# Patient Record
Sex: Male | Born: 1935 | Race: White | Hispanic: No | Marital: Married | Smoking: Former smoker
Health system: Southern US, Community
[De-identification: ages and names within clinical notes are randomized; demographics above are authoritative.]

## PROBLEM LIST (undated history)

## (undated) DIAGNOSIS — IMO0001 Reserved for inherently not codable concepts without codable children: Secondary | ICD-10-CM

## (undated) DIAGNOSIS — M1990 Unspecified osteoarthritis, unspecified site: Secondary | ICD-10-CM

## (undated) DIAGNOSIS — I2510 Atherosclerotic heart disease of native coronary artery without angina pectoris: Secondary | ICD-10-CM

## (undated) DIAGNOSIS — N400 Enlarged prostate without lower urinary tract symptoms: Secondary | ICD-10-CM

## (undated) DIAGNOSIS — I10 Essential (primary) hypertension: Secondary | ICD-10-CM

## (undated) DIAGNOSIS — K219 Gastro-esophageal reflux disease without esophagitis: Secondary | ICD-10-CM

## (undated) DIAGNOSIS — E039 Hypothyroidism, unspecified: Secondary | ICD-10-CM

## (undated) DIAGNOSIS — G629 Polyneuropathy, unspecified: Secondary | ICD-10-CM

## (undated) HISTORY — PX: OTHER SURGICAL HISTORY: SHX169

## (undated) HISTORY — PX: JOINT REPLACEMENT: SHX530

## (undated) HISTORY — PX: CORONARY ARTERY BYPASS GRAFT: SHX141

---

## 1999-04-12 ENCOUNTER — Ambulatory Visit (HOSPITAL_BASED_OUTPATIENT_CLINIC_OR_DEPARTMENT_OTHER): Admission: RE | Admit: 1999-04-12 | Discharge: 1999-04-12 | Payer: Self-pay | Admitting: Orthopedic Surgery

## 2005-11-12 ENCOUNTER — Inpatient Hospital Stay: Payer: Self-pay | Admitting: General Practice

## 2006-04-11 ENCOUNTER — Ambulatory Visit: Payer: Self-pay | Admitting: Unknown Physician Specialty

## 2006-11-27 ENCOUNTER — Ambulatory Visit: Payer: Self-pay | Admitting: Internal Medicine

## 2009-08-30 ENCOUNTER — Ambulatory Visit: Payer: Self-pay | Admitting: Cardiology

## 2009-11-02 ENCOUNTER — Encounter: Payer: Self-pay | Admitting: Cardiology

## 2009-11-03 ENCOUNTER — Encounter: Payer: Self-pay | Admitting: Cardiology

## 2009-12-04 ENCOUNTER — Encounter: Payer: Self-pay | Admitting: Cardiology

## 2011-06-10 ENCOUNTER — Ambulatory Visit: Payer: Self-pay | Admitting: Unknown Physician Specialty

## 2011-06-18 ENCOUNTER — Ambulatory Visit: Payer: Self-pay | Admitting: Internal Medicine

## 2011-07-23 ENCOUNTER — Ambulatory Visit: Payer: Self-pay | Admitting: Cardiology

## 2014-06-10 DIAGNOSIS — I739 Peripheral vascular disease, unspecified: Secondary | ICD-10-CM | POA: Diagnosis not present

## 2014-06-10 DIAGNOSIS — I25810 Atherosclerosis of coronary artery bypass graft(s) without angina pectoris: Secondary | ICD-10-CM | POA: Diagnosis not present

## 2014-06-10 DIAGNOSIS — E782 Mixed hyperlipidemia: Secondary | ICD-10-CM | POA: Diagnosis not present

## 2014-06-10 DIAGNOSIS — I10 Essential (primary) hypertension: Secondary | ICD-10-CM | POA: Diagnosis not present

## 2014-06-23 DIAGNOSIS — L821 Other seborrheic keratosis: Secondary | ICD-10-CM | POA: Diagnosis not present

## 2014-06-23 DIAGNOSIS — L570 Actinic keratosis: Secondary | ICD-10-CM | POA: Diagnosis not present

## 2014-06-23 DIAGNOSIS — L820 Inflamed seborrheic keratosis: Secondary | ICD-10-CM | POA: Diagnosis not present

## 2014-06-23 DIAGNOSIS — L578 Other skin changes due to chronic exposure to nonionizing radiation: Secondary | ICD-10-CM | POA: Diagnosis not present

## 2014-08-23 DIAGNOSIS — R12 Heartburn: Secondary | ICD-10-CM | POA: Diagnosis not present

## 2014-09-27 DIAGNOSIS — I739 Peripheral vascular disease, unspecified: Secondary | ICD-10-CM | POA: Diagnosis not present

## 2014-09-27 DIAGNOSIS — R5383 Other fatigue: Secondary | ICD-10-CM | POA: Diagnosis not present

## 2014-09-27 DIAGNOSIS — I10 Essential (primary) hypertension: Secondary | ICD-10-CM | POA: Diagnosis not present

## 2014-09-27 DIAGNOSIS — I25810 Atherosclerosis of coronary artery bypass graft(s) without angina pectoris: Secondary | ICD-10-CM | POA: Diagnosis not present

## 2014-09-27 DIAGNOSIS — R972 Elevated prostate specific antigen [PSA]: Secondary | ICD-10-CM | POA: Diagnosis not present

## 2014-09-27 DIAGNOSIS — Z79899 Other long term (current) drug therapy: Secondary | ICD-10-CM | POA: Diagnosis not present

## 2014-09-27 DIAGNOSIS — E782 Mixed hyperlipidemia: Secondary | ICD-10-CM | POA: Diagnosis not present

## 2014-09-30 DIAGNOSIS — R55 Syncope and collapse: Secondary | ICD-10-CM | POA: Diagnosis not present

## 2014-09-30 DIAGNOSIS — I6523 Occlusion and stenosis of bilateral carotid arteries: Secondary | ICD-10-CM | POA: Diagnosis not present

## 2014-10-05 DIAGNOSIS — R55 Syncope and collapse: Secondary | ICD-10-CM | POA: Diagnosis not present

## 2014-11-29 DIAGNOSIS — Z96652 Presence of left artificial knee joint: Secondary | ICD-10-CM | POA: Diagnosis not present

## 2014-11-29 DIAGNOSIS — Z96651 Presence of right artificial knee joint: Secondary | ICD-10-CM | POA: Diagnosis not present

## 2014-12-22 DIAGNOSIS — L820 Inflamed seborrheic keratosis: Secondary | ICD-10-CM | POA: Diagnosis not present

## 2014-12-22 DIAGNOSIS — D692 Other nonthrombocytopenic purpura: Secondary | ICD-10-CM | POA: Diagnosis not present

## 2014-12-22 DIAGNOSIS — L570 Actinic keratosis: Secondary | ICD-10-CM | POA: Diagnosis not present

## 2014-12-22 DIAGNOSIS — L578 Other skin changes due to chronic exposure to nonionizing radiation: Secondary | ICD-10-CM | POA: Diagnosis not present

## 2014-12-22 DIAGNOSIS — L814 Other melanin hyperpigmentation: Secondary | ICD-10-CM | POA: Diagnosis not present

## 2015-01-10 DIAGNOSIS — I10 Essential (primary) hypertension: Secondary | ICD-10-CM | POA: Diagnosis not present

## 2015-01-10 DIAGNOSIS — I25709 Atherosclerosis of coronary artery bypass graft(s), unspecified, with unspecified angina pectoris: Secondary | ICD-10-CM | POA: Diagnosis not present

## 2015-01-10 DIAGNOSIS — I739 Peripheral vascular disease, unspecified: Secondary | ICD-10-CM | POA: Diagnosis not present

## 2015-01-10 DIAGNOSIS — E782 Mixed hyperlipidemia: Secondary | ICD-10-CM | POA: Diagnosis not present

## 2015-04-04 DIAGNOSIS — M545 Low back pain: Secondary | ICD-10-CM | POA: Diagnosis not present

## 2015-04-04 DIAGNOSIS — R0609 Other forms of dyspnea: Secondary | ICD-10-CM | POA: Diagnosis not present

## 2015-04-04 DIAGNOSIS — I25810 Atherosclerosis of coronary artery bypass graft(s) without angina pectoris: Secondary | ICD-10-CM | POA: Diagnosis not present

## 2015-04-04 DIAGNOSIS — Z79899 Other long term (current) drug therapy: Secondary | ICD-10-CM | POA: Diagnosis not present

## 2015-04-04 DIAGNOSIS — G8929 Other chronic pain: Secondary | ICD-10-CM | POA: Diagnosis not present

## 2015-04-04 DIAGNOSIS — Z23 Encounter for immunization: Secondary | ICD-10-CM | POA: Diagnosis not present

## 2015-04-04 DIAGNOSIS — Z0000 Encounter for general adult medical examination without abnormal findings: Secondary | ICD-10-CM | POA: Diagnosis not present

## 2015-04-04 DIAGNOSIS — I10 Essential (primary) hypertension: Secondary | ICD-10-CM | POA: Diagnosis not present

## 2015-04-04 DIAGNOSIS — E782 Mixed hyperlipidemia: Secondary | ICD-10-CM | POA: Diagnosis not present

## 2015-04-28 DIAGNOSIS — Z79899 Other long term (current) drug therapy: Secondary | ICD-10-CM | POA: Diagnosis not present

## 2015-04-28 DIAGNOSIS — E782 Mixed hyperlipidemia: Secondary | ICD-10-CM | POA: Diagnosis not present

## 2015-04-28 DIAGNOSIS — I10 Essential (primary) hypertension: Secondary | ICD-10-CM | POA: Diagnosis not present

## 2015-06-30 DIAGNOSIS — I739 Peripheral vascular disease, unspecified: Secondary | ICD-10-CM | POA: Diagnosis not present

## 2015-06-30 DIAGNOSIS — I10 Essential (primary) hypertension: Secondary | ICD-10-CM | POA: Diagnosis not present

## 2015-06-30 DIAGNOSIS — G629 Polyneuropathy, unspecified: Secondary | ICD-10-CM | POA: Diagnosis not present

## 2015-06-30 DIAGNOSIS — E782 Mixed hyperlipidemia: Secondary | ICD-10-CM | POA: Diagnosis not present

## 2015-07-18 DIAGNOSIS — E785 Hyperlipidemia, unspecified: Secondary | ICD-10-CM | POA: Diagnosis not present

## 2015-07-18 DIAGNOSIS — M79609 Pain in unspecified limb: Secondary | ICD-10-CM | POA: Diagnosis not present

## 2015-07-18 DIAGNOSIS — I10 Essential (primary) hypertension: Secondary | ICD-10-CM | POA: Diagnosis not present

## 2015-07-18 DIAGNOSIS — I2510 Atherosclerotic heart disease of native coronary artery without angina pectoris: Secondary | ICD-10-CM | POA: Diagnosis not present

## 2015-07-31 DIAGNOSIS — E782 Mixed hyperlipidemia: Secondary | ICD-10-CM | POA: Diagnosis not present

## 2015-07-31 DIAGNOSIS — I25810 Atherosclerosis of coronary artery bypass graft(s) without angina pectoris: Secondary | ICD-10-CM | POA: Diagnosis not present

## 2015-07-31 DIAGNOSIS — I739 Peripheral vascular disease, unspecified: Secondary | ICD-10-CM | POA: Diagnosis not present

## 2015-07-31 DIAGNOSIS — I10 Essential (primary) hypertension: Secondary | ICD-10-CM | POA: Diagnosis not present

## 2015-08-22 DIAGNOSIS — I2510 Atherosclerotic heart disease of native coronary artery without angina pectoris: Secondary | ICD-10-CM | POA: Diagnosis not present

## 2015-08-22 DIAGNOSIS — I10 Essential (primary) hypertension: Secondary | ICD-10-CM | POA: Diagnosis not present

## 2015-08-22 DIAGNOSIS — E785 Hyperlipidemia, unspecified: Secondary | ICD-10-CM | POA: Diagnosis not present

## 2015-08-22 DIAGNOSIS — M79609 Pain in unspecified limb: Secondary | ICD-10-CM | POA: Diagnosis not present

## 2015-10-03 DIAGNOSIS — G8929 Other chronic pain: Secondary | ICD-10-CM | POA: Diagnosis not present

## 2015-10-03 DIAGNOSIS — Z79899 Other long term (current) drug therapy: Secondary | ICD-10-CM | POA: Diagnosis not present

## 2015-10-03 DIAGNOSIS — Z125 Encounter for screening for malignant neoplasm of prostate: Secondary | ICD-10-CM | POA: Diagnosis not present

## 2015-10-03 DIAGNOSIS — E782 Mixed hyperlipidemia: Secondary | ICD-10-CM | POA: Diagnosis not present

## 2015-10-03 DIAGNOSIS — M545 Low back pain: Secondary | ICD-10-CM | POA: Diagnosis not present

## 2015-10-03 DIAGNOSIS — I10 Essential (primary) hypertension: Secondary | ICD-10-CM | POA: Diagnosis not present

## 2015-11-14 DIAGNOSIS — H2513 Age-related nuclear cataract, bilateral: Secondary | ICD-10-CM | POA: Diagnosis not present

## 2015-12-08 DIAGNOSIS — H2513 Age-related nuclear cataract, bilateral: Secondary | ICD-10-CM | POA: Diagnosis not present

## 2015-12-18 ENCOUNTER — Encounter: Payer: Self-pay | Admitting: *Deleted

## 2015-12-21 ENCOUNTER — Ambulatory Visit: Payer: Commercial Managed Care - HMO | Admitting: Certified Registered Nurse Anesthetist

## 2015-12-21 ENCOUNTER — Encounter
Admission: RE | Disposition: A | Discharge status: Home / Self Care | Payer: Self-pay | Source: Ambulatory Visit | Attending: Ophthalmology

## 2015-12-21 ENCOUNTER — Ambulatory Visit
Admission: RE | Admit: 2015-12-21 | Discharge: 2015-12-21 | Disposition: A | Discharge status: Home / Self Care | Payer: Commercial Managed Care - HMO | Source: Ambulatory Visit | Attending: Ophthalmology | Admitting: Ophthalmology

## 2015-12-21 DIAGNOSIS — H2181 Floppy iris syndrome: Secondary | ICD-10-CM | POA: Insufficient documentation

## 2015-12-21 DIAGNOSIS — N400 Enlarged prostate without lower urinary tract symptoms: Secondary | ICD-10-CM | POA: Diagnosis not present

## 2015-12-21 DIAGNOSIS — E7800 Pure hypercholesterolemia, unspecified: Secondary | ICD-10-CM | POA: Diagnosis not present

## 2015-12-21 DIAGNOSIS — H2513 Age-related nuclear cataract, bilateral: Secondary | ICD-10-CM | POA: Diagnosis not present

## 2015-12-21 DIAGNOSIS — Z79899 Other long term (current) drug therapy: Secondary | ICD-10-CM | POA: Insufficient documentation

## 2015-12-21 DIAGNOSIS — Z87891 Personal history of nicotine dependence: Secondary | ICD-10-CM | POA: Insufficient documentation

## 2015-12-21 DIAGNOSIS — I25810 Atherosclerosis of coronary artery bypass graft(s) without angina pectoris: Secondary | ICD-10-CM | POA: Diagnosis not present

## 2015-12-21 DIAGNOSIS — K219 Gastro-esophageal reflux disease without esophagitis: Secondary | ICD-10-CM | POA: Insufficient documentation

## 2015-12-21 DIAGNOSIS — Z7982 Long term (current) use of aspirin: Secondary | ICD-10-CM | POA: Diagnosis not present

## 2015-12-21 DIAGNOSIS — Z9889 Other specified postprocedural states: Secondary | ICD-10-CM | POA: Insufficient documentation

## 2015-12-21 DIAGNOSIS — I10 Essential (primary) hypertension: Secondary | ICD-10-CM | POA: Diagnosis not present

## 2015-12-21 DIAGNOSIS — M1990 Unspecified osteoarthritis, unspecified site: Secondary | ICD-10-CM | POA: Diagnosis not present

## 2015-12-21 DIAGNOSIS — H2511 Age-related nuclear cataract, right eye: Secondary | ICD-10-CM | POA: Diagnosis not present

## 2015-12-21 DIAGNOSIS — Z96653 Presence of artificial knee joint, bilateral: Secondary | ICD-10-CM | POA: Insufficient documentation

## 2015-12-21 DIAGNOSIS — Z951 Presence of aortocoronary bypass graft: Secondary | ICD-10-CM | POA: Diagnosis not present

## 2015-12-21 HISTORY — DX: Polyneuropathy, unspecified: G62.9

## 2015-12-21 HISTORY — DX: Reserved for inherently not codable concepts without codable children: IMO0001

## 2015-12-21 HISTORY — DX: Atherosclerotic heart disease of native coronary artery without angina pectoris: I25.10

## 2015-12-21 HISTORY — DX: Gastro-esophageal reflux disease without esophagitis: K21.9

## 2015-12-21 HISTORY — PX: CATARACT EXTRACTION W/PHACO: SHX586

## 2015-12-21 HISTORY — DX: Unspecified osteoarthritis, unspecified site: M19.90

## 2015-12-21 HISTORY — DX: Hypothyroidism, unspecified: E03.9

## 2015-12-21 HISTORY — DX: Benign prostatic hyperplasia without lower urinary tract symptoms: N40.0

## 2015-12-21 HISTORY — DX: Essential (primary) hypertension: I10

## 2015-12-21 SURGERY — PHACOEMULSIFICATION, CATARACT, WITH IOL INSERTION
Anesthesia: Monitor Anesthesia Care | Site: Eye | Laterality: Right | Wound class: Clean

## 2015-12-21 MED ORDER — ARMC OPHTHALMIC DILATING GEL
1.0000 "application " | OPHTHALMIC | Status: AC | PRN
  Administered 2015-12-21 (×2): 1 via OPHTHALMIC

## 2015-12-21 MED ORDER — SODIUM HYALURONATE 23 MG/ML IO SOLN
INTRAOCULAR | Status: AC
  Filled 2015-12-21: qty 0.6

## 2015-12-21 MED ORDER — EPINEPHRINE HCL 1 MG/ML IJ SOLN
INTRAMUSCULAR | Status: AC
  Filled 2015-12-21: qty 2

## 2015-12-21 MED ORDER — TETRACAINE HCL 0.5 % OP SOLN
OPHTHALMIC | Status: AC
  Administered 2015-12-21: 1 [drp] via OPHTHALMIC
  Filled 2015-12-21: qty 2

## 2015-12-21 MED ORDER — LIDOCAINE HCL (PF) 4 % IJ SOLN
INTRAMUSCULAR | Status: AC
  Filled 2015-12-21: qty 5

## 2015-12-21 MED ORDER — MOXIFLOXACIN HCL 0.5 % OP SOLN
OPHTHALMIC | Status: AC
  Filled 2015-12-21: qty 3

## 2015-12-21 MED ORDER — CARBACHOL 0.01 % IO SOLN
INTRAOCULAR | Status: DC | PRN
  Administered 2015-12-21 (×2): 0.5 mL via INTRAOCULAR

## 2015-12-21 MED ORDER — SODIUM HYALURONATE 10 MG/ML IO SOLN
INTRAOCULAR | Status: AC
  Filled 2015-12-21: qty 0.85

## 2015-12-21 MED ORDER — POVIDONE-IODINE 5 % OP SOLN
1.0000 "application " | Freq: Once | OPHTHALMIC | Status: AC
  Administered 2015-12-21: 1 via OPHTHALMIC

## 2015-12-21 MED ORDER — SODIUM HYALURONATE 23 MG/ML IO SOLN
INTRAOCULAR | Status: DC | PRN
  Administered 2015-12-21: 0.6 mL via INTRAOCULAR

## 2015-12-21 MED ORDER — FENTANYL CITRATE (PF) 100 MCG/2ML IJ SOLN
INTRAMUSCULAR | Status: DC | PRN
  Administered 2015-12-21: 25 ug via INTRAVENOUS

## 2015-12-21 MED ORDER — BSS IO SOLN
INTRAOCULAR | Status: DC | PRN
  Administered 2015-12-21: 15 mL via INTRAOCULAR

## 2015-12-21 MED ORDER — LIDOCAINE HCL (PF) 4 % IJ SOLN
INTRAMUSCULAR | Status: DC | PRN
  Administered 2015-12-21: 4 mL via OPHTHALMIC

## 2015-12-21 MED ORDER — MOXIFLOXACIN HCL 0.5 % OP SOLN: 1.0000 [drp] | OPHTHALMIC | Status: AC | PRN

## 2015-12-21 MED ORDER — SODIUM HYALURONATE 10 MG/ML IO SOLN
INTRAOCULAR | Status: DC | PRN
  Administered 2015-12-21: 0.85 mL via INTRAOCULAR

## 2015-12-21 MED ORDER — SODIUM CHLORIDE 0.9 % IV SOLN
INTRAVENOUS | Status: DC
  Administered 2015-12-21: 10:00:00 via INTRAVENOUS

## 2015-12-21 MED ORDER — TETRACAINE HCL 0.5 % OP SOLN
1.0000 [drp] | Freq: Once | OPHTHALMIC | Status: AC
  Administered 2015-12-21: 1 [drp] via OPHTHALMIC

## 2015-12-21 MED ORDER — POVIDONE-IODINE 5 % OP SOLN
OPHTHALMIC | Status: AC
Start: 2015-12-21 — End: 2015-12-21
  Administered 2015-12-21: 1 via OPHTHALMIC
  Filled 2015-12-21: qty 30

## 2015-12-21 MED ORDER — EPINEPHRINE HCL 1 MG/ML IJ SOLN
INTRAOCULAR | Status: DC | PRN
  Administered 2015-12-21: 1 mL via OPHTHALMIC

## 2015-12-21 MED ORDER — MOXIFLOXACIN HCL 0.5 % OP SOLN
OPHTHALMIC | Status: DC | PRN
  Administered 2015-12-21: 1 [drp] via OPHTHALMIC

## 2015-12-21 MED ORDER — ARMC OPHTHALMIC DILATING GEL
OPHTHALMIC | Status: AC
  Administered 2015-12-21: 1 via OPHTHALMIC
  Filled 2015-12-21: qty 0.25

## 2015-12-21 SURGICAL SUPPLY — 26 items
CANNULA ANT/CHMB 27GA (MISCELLANEOUS) ×6 IMPLANT
CUP MEDICINE 2OZ PLAST GRAD ST (MISCELLANEOUS) ×3 IMPLANT
FILTER MILLEX .045 (MISCELLANEOUS) ×1 IMPLANT
FLTR MILLEX .045 (MISCELLANEOUS) ×3
GLOVE BIO SURGEON STRL SZ8 (GLOVE) ×3 IMPLANT
GLOVE BIOGEL M 6.5 STRL (GLOVE) ×3 IMPLANT
GLOVE SURG LX 7.5 STRW (GLOVE) ×2
GLOVE SURG LX STRL 7.5 STRW (GLOVE) ×1 IMPLANT
GOWN STRL REUS W/ TWL LRG LVL3 (GOWN DISPOSABLE) ×2 IMPLANT
GOWN STRL REUS W/TWL LRG LVL3 (GOWN DISPOSABLE) ×4
LENS IOL ACRSF IQ PC 24.5 (Intraocular Lens) ×1 IMPLANT
LENS IOL ACRYSOF IQ POST 24.5 (Intraocular Lens) ×3 IMPLANT
PACK CATARACT (MISCELLANEOUS) ×3 IMPLANT
PACK CATARACT BRASINGTON LX (MISCELLANEOUS) ×3 IMPLANT
PACK EYE AFTER SURG (MISCELLANEOUS) ×3 IMPLANT
RING MALYGIN (MISCELLANEOUS) ×3 IMPLANT
SOL BSS BAG (MISCELLANEOUS) ×3
SOL PREP PVP 2OZ (MISCELLANEOUS) ×3
SOLUTION BSS BAG (MISCELLANEOUS) ×1 IMPLANT
SOLUTION PREP PVP 2OZ (MISCELLANEOUS) ×1 IMPLANT
SUT ETHILON 10 0 CS140 6 (SUTURE) ×3 IMPLANT
SYR 3ML LL SCALE MARK (SYRINGE) ×6 IMPLANT
SYR 5ML LL (SYRINGE) ×6 IMPLANT
SYR TB 1ML 27GX1/2 LL (SYRINGE) ×3 IMPLANT
WATER STERILE IRR 1000ML POUR (IV SOLUTION) ×3 IMPLANT
WIPE NON LINTING 3.25X3.25 (MISCELLANEOUS) ×3 IMPLANT

## 2015-12-21 NOTE — Op Note (Signed)
OPERATIVE NOTE  NICHLAS MORELAND [id number] 12/21/2015   PREOPERATIVE DIAGNOSIS:  Nuclear sclerotic cataract right eye.  H25.11   POSTOPERATIVE DIAGNOSIS:     1.  Nuclear sclerotic cataract right eye.   2.  Intraoperative floppy iris syndrome   PROCEDURE:  Complex Phacoemusification with posterior chamber intraocular lens placement of the right eye requiring use of malyugin ring for stabilization of the iris.  LENS:   Implant Name Type Inv. Item Serial No. Manufacturer Lot No. LRB No. Used  IMPLANT LENS - [id number] Intraocular Lens IMPLANT LENS [id number] ALCON   Right 1       [id number] 24.5   ULTRASOUND TIME: 1 minutes 13 seconds.  CDE 9.81   SURGEON:  Benay Pillow, MD, MPH  ANESTHESIOLOGIST: Anesthesiologist: Martha Clan, MD CRNA: Demetrius Charity, CRNA   ANESTHESIA:  Topical with tetracaine drops and 2% Xylocaine jelly, augmented with 1% preservative-free intracameral lidocaine.  ESTIMATED BLOOD LOSS: less than 1 mL.   COMPLICATIONS:  None.   DESCRIPTION OF PROCEDURE:  The patient was identified in the holding room and transported to the operating room and placed in the supine position under the operating microscope.  The right eye was identified as the operative eye and it was prepped and draped in the usual sterile ophthalmic fashion.   A 1.0 millimeter clear-corneal paracentesis was made at the 10:30 position. 0.5 ml of preservative-free 1% lidocaine with epinephrine was injected into the anterior chamber.  The anterior chamber was filled with Healon 5 viscoelastic.  A 2.4 millimeter keratome was used to make a near-clear corneal incision at the 8:00 position.  A curvilinear capsulorrhexis was made with a cystotome and capsulorrhexis forceps.  Balanced salt solution was used to hydrodissect and hydrodelineate the nucleus.  Despite gentle techniques, the iris was very floppy and immediately prolapsed.    A 6.25 mm malyugin ring was placed.  The iris was large but floppy.   The brow was very prominent but fair visualization was maintained throughout the case. At this time the malyugin ring became disinserted from the iris.  Additional healon was placed and the ring was reinserted on the iris.   Phacoemulsification was then used in stop and chop fashion to remove the lens nucleus and epinucleus.    At this time the malyugin ring became disinserted from the iris.  Additional healon was placed and the ring was reinserted on the iris.    The remaining cortex was then removed using the irrigation and aspiration handpiece. Healon was then placed into the capsular bag to distend it for lens placement.  A lens was then injected into the capsular bag.   The malyugin ring was removed.  The ring broke at the seam during removal but this did not result in any injury.  Miostat was injected.   The remaining viscoelastic was aspirated.  Additional miostat was injected.  Because of the repeated iris prolapse, a suture was placed at the temporal wound prior to additional hydration.   Wounds were hydrated with balanced salt solution.  The anterior chamber was inflated to a physiologic pressure with balanced salt solution.   Intracameral vigamox 0.1 mL undiluted was injected into the eye.  No wound leaks were noted.  Topical Vigamox drops were applied to the eye.  The patient was taken to the recovery room in stable condition without complications of anesthesia or surgery  Benay Pillow 12/21/2015, 12:11 PM

## 2015-12-21 NOTE — Anesthesia Preprocedure Evaluation (Signed)
Anesthesia Evaluation  Patient identified by MRN, date of birth, ID band Patient awake    Reviewed: Allergy & Precautions, H&P , NPO status , Patient's Chart, lab work & pertinent test results, reviewed documented beta blocker date and time   History of Anesthesia Complications Negative for: history of anesthetic complications  Airway Mallampati: III  TM Distance: >3 FB Neck ROM: full    Dental no notable dental hx. (+) Edentulous Upper, Partial Lower, Upper Dentures   Pulmonary shortness of breath and with exertion, neg sleep apnea, neg COPD, neg recent URI, former smoker,    Pulmonary exam normal breath sounds clear to auscultation       Cardiovascular Exercise Tolerance: Good hypertension, (-) angina+ CAD and + CABG  (-) Past MI and (-) Cardiac Stents Normal cardiovascular exam(-) dysrhythmias (-) Valvular Problems/Murmurs Rhythm:regular Rate:Normal     Neuro/Psych negative neurological ROS  negative psych ROS   GI/Hepatic Neg liver ROS, GERD  ,  Endo/Other  neg diabetesHypothyroidism   Renal/GU negative Renal ROS  negative genitourinary   Musculoskeletal   Abdominal   Peds  Hematology negative hematology ROS (+)   Anesthesia Other Findings Past Medical History: No date: Arthritis No date: BPH (benign prostatic hyperplasia) No date: Coronary artery disease No date: GERD (gastroesophageal reflux disease) No date: Hypertension No date: Hypothyroidism No date: Neuropathy (HCC) No date: Shortness of breath dyspnea     Comment: DOE   Reproductive/Obstetrics negative OB ROS                             Anesthesia Physical Anesthesia Plan  ASA: III  Anesthesia Plan: MAC   Post-op Pain Management:    Induction:   Airway Management Planned:   Additional Equipment:   Intra-op Plan:   Post-operative Plan:   Informed Consent: I have reviewed the patients History and Physical,  chart, labs and discussed the procedure including the risks, benefits and alternatives for the proposed anesthesia with the patient or authorized representative who has indicated his/her understanding and acceptance.   Dental Advisory Given  Plan Discussed with: Anesthesiologist, CRNA and Surgeon  Anesthesia Plan Comments:         Anesthesia Quick Evaluation

## 2015-12-21 NOTE — Anesthesia Postprocedure Evaluation (Signed)
Anesthesia Post Note  Patient: Robert Reynolds  Procedure(s) Performed: Procedure(s) (LRB): CATARACT EXTRACTION PHACO AND INTRAOCULAR LENS PLACEMENT (IOC) (Right)  Patient location during evaluation: PACU Anesthesia Type: MAC Level of consciousness: awake and alert and oriented Pain management: satisfactory to patient Vital Signs Assessment: post-procedure vital signs reviewed and stable Respiratory status: respiratory function stable Cardiovascular status: stable Anesthetic complications: no    Last Vitals:  Vitals:   12/21/15 0935  BP: 112/78  Pulse: (!) 55  Resp: 16  Temp: 36.1 C    Last Pain:  Vitals:   12/21/15 0935  TempSrc: Tympanic                 Blima Singer

## 2015-12-21 NOTE — H&P (Signed)
  The History and Physical notes are on paper, have been signed, and are to be scanned. The patient remains stable and unchanged from the H&P.   Previous H&P reviewed, patient examined, and there are no changes.  Robert Reynolds 12/21/2015 10:34 AM

## 2015-12-21 NOTE — Transfer of Care (Signed)
Immediate Anesthesia Transfer of Care Note  Patient: Robert Reynolds  Procedure(s) Performed: Procedure(s) with comments: CATARACT EXTRACTION PHACO AND INTRAOCULAR LENS PLACEMENT (IOC) (Right) - [place] 1.13 AP% 13.3 CDE 9.81 Fluid Pack Lot # [id number]  Patient Location: PACU  Anesthesia Type:MAC  Level of Consciousness: awake, alert  and oriented  Airway & Oxygen Therapy: Patient Spontanous Breathing  Post-op Assessment: Report given to RN and Post -op Vital signs reviewed and stable  Post vital signs: Reviewed and stable  Last Vitals:  Vitals:   12/21/15 0935  BP: 112/78  Pulse: (!) 55  Resp: 16  Temp: 36.1 C    Last Pain:  Vitals:   12/21/15 0935  TempSrc: Tympanic         Complications: No apparent anesthesia complications

## 2015-12-21 NOTE — Discharge Instructions (Signed)
Eye Surgery Discharge Instructions  Expect mild scratchy sensation or mild soreness. DO NOT RUB YOUR EYE!  The day of surgery:  Minimal physical activity, but bed rest is not required  No reading, computer work, or close hand work  No bending, lifting, or straining.  May watch TV  For 24 hours:  No driving, legal decisions, or alcoholic beverages  Safety precautions  Eat anything you prefer: It is better to start with liquids, then soup then solid foods.  _____ Eye patch should be worn until postoperative exam tomorrow.  ____ Solar shield eyeglasses should be worn for comfort in the sunlight/patch while sleeping  Resume all regular medications including aspirin or Coumadin if these were discontinued prior to surgery. You may shower, bathe, shave, or wash your hair. Tylenol may be taken for mild discomfort.  Call your doctor if you experience significant pain, nausea, or vomiting, fever > 101 or other signs of infection. [phone] or [phone] Specific instructions:  Follow-up Information    Benay Pillow, MD .   Specialty:  Ophthalmology Why:  August 18 at 8:55am Contact information: [address] [place] [address] [phone]

## 2016-01-04 DIAGNOSIS — D692 Other nonthrombocytopenic purpura: Secondary | ICD-10-CM | POA: Diagnosis not present

## 2016-01-04 DIAGNOSIS — L570 Actinic keratosis: Secondary | ICD-10-CM | POA: Diagnosis not present

## 2016-01-04 DIAGNOSIS — L820 Inflamed seborrheic keratosis: Secondary | ICD-10-CM | POA: Diagnosis not present

## 2016-01-04 DIAGNOSIS — L821 Other seborrheic keratosis: Secondary | ICD-10-CM | POA: Diagnosis not present

## 2016-01-04 DIAGNOSIS — L578 Other skin changes due to chronic exposure to nonionizing radiation: Secondary | ICD-10-CM | POA: Diagnosis not present

## 2016-01-10 DIAGNOSIS — H2512 Age-related nuclear cataract, left eye: Secondary | ICD-10-CM | POA: Diagnosis not present

## 2016-01-12 ENCOUNTER — Encounter: Payer: Self-pay | Admitting: *Deleted

## 2016-01-18 ENCOUNTER — Ambulatory Visit: Payer: Commercial Managed Care - HMO | Admitting: Anesthesiology

## 2016-01-18 ENCOUNTER — Encounter
Admission: RE | Disposition: A | Discharge status: Home / Self Care | Payer: Self-pay | Source: Ambulatory Visit | Attending: Ophthalmology

## 2016-01-18 ENCOUNTER — Ambulatory Visit
Admission: RE | Admit: 2016-01-18 | Discharge: 2016-01-18 | Disposition: A | Discharge status: Home / Self Care | Payer: Commercial Managed Care - HMO | Source: Ambulatory Visit | Attending: Ophthalmology | Admitting: Ophthalmology

## 2016-01-18 ENCOUNTER — Encounter: Payer: Self-pay | Admitting: *Deleted

## 2016-01-18 DIAGNOSIS — Z7982 Long term (current) use of aspirin: Secondary | ICD-10-CM | POA: Insufficient documentation

## 2016-01-18 DIAGNOSIS — E039 Hypothyroidism, unspecified: Secondary | ICD-10-CM | POA: Diagnosis not present

## 2016-01-18 DIAGNOSIS — E7800 Pure hypercholesterolemia, unspecified: Secondary | ICD-10-CM | POA: Diagnosis not present

## 2016-01-18 DIAGNOSIS — Z79899 Other long term (current) drug therapy: Secondary | ICD-10-CM | POA: Diagnosis not present

## 2016-01-18 DIAGNOSIS — I10 Essential (primary) hypertension: Secondary | ICD-10-CM | POA: Diagnosis not present

## 2016-01-18 DIAGNOSIS — G629 Polyneuropathy, unspecified: Secondary | ICD-10-CM | POA: Insufficient documentation

## 2016-01-18 DIAGNOSIS — I25810 Atherosclerosis of coronary artery bypass graft(s) without angina pectoris: Secondary | ICD-10-CM | POA: Diagnosis not present

## 2016-01-18 DIAGNOSIS — Z87891 Personal history of nicotine dependence: Secondary | ICD-10-CM | POA: Diagnosis not present

## 2016-01-18 DIAGNOSIS — Z951 Presence of aortocoronary bypass graft: Secondary | ICD-10-CM | POA: Insufficient documentation

## 2016-01-18 DIAGNOSIS — H2512 Age-related nuclear cataract, left eye: Secondary | ICD-10-CM | POA: Insufficient documentation

## 2016-01-18 DIAGNOSIS — N400 Enlarged prostate without lower urinary tract symptoms: Secondary | ICD-10-CM | POA: Diagnosis not present

## 2016-01-18 DIAGNOSIS — K219 Gastro-esophageal reflux disease without esophagitis: Secondary | ICD-10-CM | POA: Insufficient documentation

## 2016-01-18 DIAGNOSIS — Z96653 Presence of artificial knee joint, bilateral: Secondary | ICD-10-CM | POA: Insufficient documentation

## 2016-01-18 DIAGNOSIS — I2510 Atherosclerotic heart disease of native coronary artery without angina pectoris: Secondary | ICD-10-CM | POA: Diagnosis not present

## 2016-01-18 DIAGNOSIS — H2181 Floppy iris syndrome: Secondary | ICD-10-CM | POA: Insufficient documentation

## 2016-01-18 HISTORY — PX: CATARACT EXTRACTION W/PHACO: SHX586

## 2016-01-18 SURGERY — PHACOEMULSIFICATION, CATARACT, WITH IOL INSERTION
Anesthesia: Monitor Anesthesia Care | Site: Eye | Laterality: Left | Wound class: Clean

## 2016-01-18 MED ORDER — MIDAZOLAM HCL 2 MG/2ML IJ SOLN
INTRAMUSCULAR | Status: DC | PRN
  Administered 2016-01-18: 1 mg via INTRAVENOUS

## 2016-01-18 MED ORDER — BSS IO SOLN
INTRAOCULAR | Status: DC | PRN
  Administered 2016-01-18: 10:00:00 via OPHTHALMIC

## 2016-01-18 MED ORDER — SODIUM HYALURONATE 10 MG/ML IO SOLN
INTRAOCULAR | Status: AC
  Filled 2016-01-18: qty 0.85

## 2016-01-18 MED ORDER — MOXIFLOXACIN HCL 0.5 % OP SOLN
OPHTHALMIC | Status: DC | PRN
  Administered 2016-01-18: 1 [drp] via OPHTHALMIC

## 2016-01-18 MED ORDER — SODIUM CHLORIDE 0.9 % IV SOLN
INTRAVENOUS | Status: DC
  Administered 2016-01-18: 10:00:00 via INTRAVENOUS

## 2016-01-18 MED ORDER — SODIUM HYALURONATE 23 MG/ML IO SOLN
INTRAOCULAR | Status: DC | PRN
  Administered 2016-01-18: 0.6 mL via INTRAOCULAR

## 2016-01-18 MED ORDER — LIDOCAINE HCL (PF) 4 % IJ SOLN
INTRAMUSCULAR | Status: AC
  Filled 2016-01-18: qty 5

## 2016-01-18 MED ORDER — MOXIFLOXACIN HCL 0.5 % OP SOLN
1.0000 [drp] | OPHTHALMIC | Status: DC | PRN
Start: 2016-01-18 — End: 2016-01-18

## 2016-01-18 MED ORDER — SODIUM HYALURONATE 23 MG/ML IO SOLN
INTRAOCULAR | Status: AC
  Filled 2016-01-18: qty 0.6

## 2016-01-18 MED ORDER — FENTANYL CITRATE (PF) 100 MCG/2ML IJ SOLN
INTRAMUSCULAR | Status: DC | PRN
  Administered 2016-01-18: 50 ug via INTRAVENOUS

## 2016-01-18 MED ORDER — EPINEPHRINE HCL 1 MG/ML IJ SOLN
INTRAMUSCULAR | Status: AC
  Filled 2016-01-18: qty 2

## 2016-01-18 MED ORDER — ARMC OPHTHALMIC DILATING GEL
1.0000 "application " | OPHTHALMIC | Status: AC | PRN
  Administered 2016-01-18 (×2): 1 via OPHTHALMIC

## 2016-01-18 MED ORDER — POVIDONE-IODINE 5 % OP SOLN
1.0000 "application " | Freq: Once | OPHTHALMIC | Status: AC
  Administered 2016-01-18: 1 via OPHTHALMIC

## 2016-01-18 MED ORDER — SODIUM HYALURONATE 10 MG/ML IO SOLN
INTRAOCULAR | Status: DC | PRN
  Administered 2016-01-18: 0.85 mL via INTRAOCULAR

## 2016-01-18 MED ORDER — POVIDONE-IODINE 5 % OP SOLN
OPHTHALMIC | Status: AC
  Filled 2016-01-18: qty 30

## 2016-01-18 MED ORDER — TETRACAINE HCL 0.5 % OP SOLN
OPHTHALMIC | Status: AC
  Filled 2016-01-18: qty 2

## 2016-01-18 MED ORDER — ARMC OPHTHALMIC DILATING GEL
OPHTHALMIC | Status: AC
  Filled 2016-01-18: qty 0.25

## 2016-01-18 MED ORDER — TETRACAINE HCL 0.5 % OP SOLN
1.0000 [drp] | Freq: Once | OPHTHALMIC | Status: AC
  Administered 2016-01-18: 1 [drp] via OPHTHALMIC

## 2016-01-18 MED ORDER — BSS IO SOLN
INTRAOCULAR | Status: DC | PRN
  Administered 2016-01-18: 4 mL via OPHTHALMIC

## 2016-01-18 MED ORDER — MOXIFLOXACIN HCL 0.5 % OP SOLN
OPHTHALMIC | Status: AC
  Filled 2016-01-18: qty 3

## 2016-01-18 SURGICAL SUPPLY — 22 items
CANNULA ANT/CHMB 27GA (MISCELLANEOUS) ×6 IMPLANT
CUP MEDICINE 2OZ PLAST GRAD ST (MISCELLANEOUS) ×3 IMPLANT
GLOVE BIO SURGEON STRL SZ8 (GLOVE) ×3 IMPLANT
GLOVE BIOGEL M 6.5 STRL (GLOVE) ×3 IMPLANT
GLOVE SURG LX 7.5 STRW (GLOVE) ×2
GLOVE SURG LX STRL 7.5 STRW (GLOVE) ×1 IMPLANT
GOWN STRL REUS W/ TWL LRG LVL3 (GOWN DISPOSABLE) ×2 IMPLANT
GOWN STRL REUS W/TWL LRG LVL3 (GOWN DISPOSABLE) ×4
LENS IOL ACRSF IQ PC 25.0 (Intraocular Lens) ×1 IMPLANT
LENS IOL ACRYSOF IQ POST 25.0 (Intraocular Lens) ×3 IMPLANT
PACK CATARACT (MISCELLANEOUS) ×3 IMPLANT
PACK CATARACT BRASINGTON LX (MISCELLANEOUS) ×3 IMPLANT
PACK EYE AFTER SURG (MISCELLANEOUS) ×3 IMPLANT
SOL BSS BAG (MISCELLANEOUS) ×3
SOL PREP PVP 2OZ (MISCELLANEOUS) ×3
SOLUTION BSS BAG (MISCELLANEOUS) ×1 IMPLANT
SOLUTION PREP PVP 2OZ (MISCELLANEOUS) ×1 IMPLANT
SYR 3ML LL SCALE MARK (SYRINGE) ×6 IMPLANT
SYR 5ML LL (SYRINGE) ×3 IMPLANT
SYR TB 1ML 27GX1/2 LL (SYRINGE) ×3 IMPLANT
WATER STERILE IRR 250ML POUR (IV SOLUTION) ×3 IMPLANT
WIPE NON LINTING 3.25X3.25 (MISCELLANEOUS) ×3 IMPLANT

## 2016-01-18 NOTE — Discharge Instructions (Signed)
Eye Surgery Discharge Instructions  Expect mild scratchy sensation or mild soreness. DO NOT RUB YOUR EYE!  The day of surgery:  Minimal physical activity, but bed rest is not required  No reading, computer work, or close hand work  No bending, lifting, or straining.  May watch TV  For 24 hours:  No driving, legal decisions, or alcoholic beverages  Safety precautions  Eat anything you prefer: It is better to start with liquids, then soup then solid foods.  _____ Eye patch should be worn until postoperative exam tomorrow.  ____ Solar shield eyeglasses should be worn for comfort in the sunlight/patch while sleeping  Resume all regular medications including aspirin or Coumadin if these were discontinued prior to surgery. You may shower, bathe, shave, or wash your hair. Tylenol may be taken for mild discomfort.  Call your doctor if you experience significant pain, nausea, or vomiting, fever > 101 or other signs of infection. [phone] or [phone] Specific instructions:  Follow-up Information    Benay Pillow, MD .   Specialty:  Ophthalmology Why:  01/19/16 at 8:45 Contact information: [address] [place] [address] [phone]          Eye Surgery Discharge Instructions  Expect mild scratchy sensation or mild soreness. DO NOT RUB YOUR EYE!  The day of surgery:  Minimal physical activity, but bed rest is not required  No reading, computer work, or close hand work  No bending, lifting, or straining.  May watch TV  For 24 hours:  No driving, legal decisions, or alcoholic beverages  Safety precautions  Eat anything you prefer: It is better to start with liquids, then soup then solid foods.  _____ Eye patch should be worn until postoperative exam tomorrow.  ____ Solar shield eyeglasses should be worn for comfort in the sunlight/patch while sleeping  Resume all regular medications including aspirin or Coumadin if these were discontinued  prior to surgery. You may shower, bathe, shave, or wash your hair. Tylenol may be taken for mild discomfort.  Call your doctor if you experience significant pain, nausea, or vomiting, fever > 101 or other signs of infection. [phone] or [phone] Specific instructions:  Follow-up Information    Benay Pillow, MD .   Specialty:  Ophthalmology Why:  01/19/16 at 8:45 Contact information: [address] [place] [address] [phone]

## 2016-01-18 NOTE — Anesthesia Postprocedure Evaluation (Signed)
Anesthesia Post Note  Patient: Robert Reynolds  Procedure(s) Performed: Procedure(s) (LRB): CATARACT EXTRACTION PHACO AND INTRAOCULAR LENS PLACEMENT (IOC) (Left)  Patient location during evaluation: Short Stay Anesthesia Type: MAC Level of consciousness: awake and alert and oriented Pain management: pain level controlled Vital Signs Assessment: post-procedure vital signs reviewed and stable Respiratory status: spontaneous breathing Cardiovascular status: stable Postop Assessment: no headache    Last Vitals:  Vitals:   01/18/16 1035 01/18/16 1047  BP: 126/75 116/70  Pulse: (!) 54 (!) 54  Resp: 16   Temp: (!) 35.9 C     Last Pain:  Vitals:   01/18/16 1035  TempSrc: Tympanic                 Lanora Manis

## 2016-01-18 NOTE — Op Note (Signed)
OPERATIVE NOTE  TREYVIAN DONAGHEY [id number] 01/18/2016   PREOPERATIVE DIAGNOSIS:  Nuclear sclerotic cataract left eye.  H25.12   POSTOPERATIVE DIAGNOSIS:    Nuclear sclerotic cataract left eye.   2. Intraoperative Floppy iris syndrome.   PROCEDURE:  Phacoemusification with posterior chamber intraocular lens placement of the left eye   LENS:   Implant Name Type Inv. Item Serial No. Manufacturer Lot No. LRB No. Used  IMPLANT LENS - [id number] Intraocular Lens IMPLANT LENS [id number] ALCON   Left 1       [id number] 25.0   ULTRASOUND TIME: 0 minutes 44.9 seconds.  CDE 5.07   SURGEON:  Benay Pillow, MD, MPH   ANESTHESIA:  Topical with tetracaine drops and 2% Xylocaine jelly, augmented with 1% preservative-free intracameral lidocaine.   COMPLICATIONS:  None.   DESCRIPTION OF PROCEDURE:  The patient was identified in the holding room and transported to the operating room and placed in the supine position under the operating microscope.  The left eye was identified as the operative eye and it was prepped and draped in the usual sterile ophthalmic fashion.   A 1.0 millimeter clear-corneal paracentesis was made at the 5:00 position. 0.5 ml of preservative-free 1% lidocaine with epinephrine was injected into the anterior chamber.  The wounds were made relatively anterior, given the history of use of tamsulosin and the IFIS on the first eye. No iris prolapse occurred throughout the case, and no additional measures were taken.   The anterior chamber was filled with Healon 5 viscoelastic.  A 2.4 millimeter keratome was used to make a near-clear corneal incision at the 2:00 position.  A curvilinear capsulorrhexis was made with a cystotome and capsulorrhexis forceps.  Balanced salt solution was used to hydrodissect and hydrodelineate the nucleus.   Phacoemulsification was then used in stop and chop fashion to remove the lens nucleus and epinucleus.  The remaining cortex was then removed using the  irrigation and aspiration handpiece. Healon was then placed into the capsular bag to distend it for lens placement.  A lens was then injected into the capsular bag.  The remaining viscoelastic was aspirated.   Wounds were hydrated with balanced salt solution.  The anterior chamber was inflated to a physiologic pressure with balanced salt solution.  Intracameral vigamox 0.1 mL undiltued was injected into the eye.  No wound leaks were noted.  Topical Vigamox drops were applied to the eye.  The patient was taken to the recovery room in stable condition without complications of anesthesia or surgery  Benay Pillow 01/18/2016, 10:33 AM

## 2016-01-18 NOTE — Transfer of Care (Signed)
Immediate Anesthesia Transfer of Care Note  Patient: Robert Reynolds  Procedure(s) Performed: Procedure(s) with comments: CATARACT EXTRACTION PHACO AND INTRAOCULAR LENS PLACEMENT (IOC) (Left) - [place] 44.9 AP% 11.03 CDE 5.07 Fluid Pack Lot # [id number]  Patient Location: PACU and Short Stay  Anesthesia Type:MAC  Level of Consciousness: awake, alert  and oriented  Airway & Oxygen Therapy: Patient Spontanous Breathing  Post-op Assessment: Report given to RN and Post -op Vital signs reviewed and stable  Post vital signs: Reviewed and stable  Last Vitals:  Vitals:   01/18/16 0901 01/18/16 1034  BP: (!) 146/82 126/75  Pulse: (!) 47 (!) 53  Resp: 16 18  Temp: (!) 35.6 C (!) 35.7 C    Last Pain:  Vitals:   01/18/16 1034  TempSrc: Tympanic         Complications: No apparent anesthesia complications

## 2016-01-18 NOTE — H&P (Signed)
The History and Physical notes are on paper, have been signed, and are to be scanned. The patient remains stable and unchanged from the H&P.   Previous H&P reviewed, patient examined, and there are no changes.  Robert Reynolds 01/18/2016 9:24 AM

## 2016-01-18 NOTE — Anesthesia Preprocedure Evaluation (Addendum)
Anesthesia Evaluation  Patient identified by MRN, date of birth, ID band Patient awake    Reviewed: Allergy & Precautions, NPO status , Patient's Chart, lab work & pertinent test results, reviewed documented beta blocker date and time   Airway Mallampati: III  TM Distance: >3 FB     Dental  (+) Chipped, Upper Dentures, Partial Lower   Pulmonary shortness of breath, former smoker,           Cardiovascular hypertension, Pt. on medications and Pt. on home beta blockers + CAD and + CABG       Neuro/Psych    GI/Hepatic GERD  Controlled,  Endo/Other  Hypothyroidism   Renal/GU      Musculoskeletal   Abdominal   Peds  Hematology   Anesthesia Other Findings   Reproductive/Obstetrics                            Anesthesia Physical Anesthesia Plan  ASA: III  Anesthesia Plan: MAC   Post-op Pain Management:    Induction:   Airway Management Planned:   Additional Equipment:   Intra-op Plan:   Post-operative Plan:   Informed Consent: I have reviewed the patients History and Physical, chart, labs and discussed the procedure including the risks, benefits and alternatives for the proposed anesthesia with the patient or authorized representative who has indicated his/her understanding and acceptance.     Plan Discussed with: CRNA  Anesthesia Plan Comments:         Anesthesia Quick Evaluation

## 2016-02-01 DIAGNOSIS — I739 Peripheral vascular disease, unspecified: Secondary | ICD-10-CM | POA: Diagnosis not present

## 2016-02-01 DIAGNOSIS — I25810 Atherosclerosis of coronary artery bypass graft(s) without angina pectoris: Secondary | ICD-10-CM | POA: Diagnosis not present

## 2016-02-01 DIAGNOSIS — E782 Mixed hyperlipidemia: Secondary | ICD-10-CM | POA: Diagnosis not present

## 2016-02-01 DIAGNOSIS — I10 Essential (primary) hypertension: Secondary | ICD-10-CM | POA: Diagnosis not present

## 2016-03-26 DIAGNOSIS — L821 Other seborrheic keratosis: Secondary | ICD-10-CM | POA: Diagnosis not present

## 2016-03-26 DIAGNOSIS — L820 Inflamed seborrheic keratosis: Secondary | ICD-10-CM | POA: Diagnosis not present

## 2016-03-26 DIAGNOSIS — L570 Actinic keratosis: Secondary | ICD-10-CM | POA: Diagnosis not present

## 2016-03-26 DIAGNOSIS — L578 Other skin changes due to chronic exposure to nonionizing radiation: Secondary | ICD-10-CM | POA: Diagnosis not present

## 2016-04-04 DIAGNOSIS — E782 Mixed hyperlipidemia: Secondary | ICD-10-CM | POA: Diagnosis not present

## 2016-04-04 DIAGNOSIS — R2689 Other abnormalities of gait and mobility: Secondary | ICD-10-CM | POA: Diagnosis not present

## 2016-04-04 DIAGNOSIS — Z0000 Encounter for general adult medical examination without abnormal findings: Secondary | ICD-10-CM | POA: Diagnosis not present

## 2016-04-04 DIAGNOSIS — E559 Vitamin D deficiency, unspecified: Secondary | ICD-10-CM | POA: Diagnosis not present

## 2016-04-04 DIAGNOSIS — G6289 Other specified polyneuropathies: Secondary | ICD-10-CM | POA: Diagnosis not present

## 2016-04-04 DIAGNOSIS — Z79899 Other long term (current) drug therapy: Secondary | ICD-10-CM | POA: Diagnosis not present

## 2016-04-04 DIAGNOSIS — R5381 Other malaise: Secondary | ICD-10-CM | POA: Diagnosis not present

## 2016-04-04 DIAGNOSIS — Z125 Encounter for screening for malignant neoplasm of prostate: Secondary | ICD-10-CM | POA: Diagnosis not present

## 2016-04-04 DIAGNOSIS — I10 Essential (primary) hypertension: Secondary | ICD-10-CM | POA: Diagnosis not present

## 2016-04-04 DIAGNOSIS — R5383 Other fatigue: Secondary | ICD-10-CM | POA: Diagnosis not present

## 2016-04-04 DIAGNOSIS — I25810 Atherosclerosis of coronary artery bypass graft(s) without angina pectoris: Secondary | ICD-10-CM | POA: Diagnosis not present

## 2016-05-29 DIAGNOSIS — R200 Anesthesia of skin: Secondary | ICD-10-CM | POA: Diagnosis not present

## 2016-05-29 DIAGNOSIS — G609 Hereditary and idiopathic neuropathy, unspecified: Secondary | ICD-10-CM | POA: Diagnosis not present

## 2016-06-03 DIAGNOSIS — G609 Hereditary and idiopathic neuropathy, unspecified: Secondary | ICD-10-CM | POA: Diagnosis not present

## 2016-06-03 DIAGNOSIS — R200 Anesthesia of skin: Secondary | ICD-10-CM | POA: Diagnosis not present

## 2016-06-14 DIAGNOSIS — G608 Other hereditary and idiopathic neuropathies: Secondary | ICD-10-CM | POA: Diagnosis not present

## 2016-07-18 DIAGNOSIS — E782 Mixed hyperlipidemia: Secondary | ICD-10-CM | POA: Diagnosis not present

## 2016-07-18 DIAGNOSIS — I739 Peripheral vascular disease, unspecified: Secondary | ICD-10-CM | POA: Diagnosis not present

## 2016-07-18 DIAGNOSIS — I10 Essential (primary) hypertension: Secondary | ICD-10-CM | POA: Diagnosis not present

## 2016-07-18 DIAGNOSIS — I2510 Atherosclerotic heart disease of native coronary artery without angina pectoris: Secondary | ICD-10-CM | POA: Diagnosis not present

## 2016-07-18 DIAGNOSIS — I25810 Atherosclerosis of coronary artery bypass graft(s) without angina pectoris: Secondary | ICD-10-CM | POA: Diagnosis not present

## 2016-08-13 DIAGNOSIS — Z961 Presence of intraocular lens: Secondary | ICD-10-CM | POA: Diagnosis not present

## 2016-08-28 DIAGNOSIS — G608 Other hereditary and idiopathic neuropathies: Secondary | ICD-10-CM | POA: Diagnosis not present

## 2016-09-23 DIAGNOSIS — L570 Actinic keratosis: Secondary | ICD-10-CM | POA: Diagnosis not present

## 2016-09-23 DIAGNOSIS — L820 Inflamed seborrheic keratosis: Secondary | ICD-10-CM | POA: Diagnosis not present

## 2016-09-23 DIAGNOSIS — L821 Other seborrheic keratosis: Secondary | ICD-10-CM | POA: Diagnosis not present

## 2016-09-23 DIAGNOSIS — L812 Freckles: Secondary | ICD-10-CM | POA: Diagnosis not present

## 2016-09-23 DIAGNOSIS — L578 Other skin changes due to chronic exposure to nonionizing radiation: Secondary | ICD-10-CM | POA: Diagnosis not present

## 2016-10-08 DIAGNOSIS — E782 Mixed hyperlipidemia: Secondary | ICD-10-CM | POA: Diagnosis not present

## 2016-10-08 DIAGNOSIS — Z0000 Encounter for general adult medical examination without abnormal findings: Secondary | ICD-10-CM | POA: Diagnosis not present

## 2016-10-08 DIAGNOSIS — I10 Essential (primary) hypertension: Secondary | ICD-10-CM | POA: Diagnosis not present

## 2016-10-08 DIAGNOSIS — Z1211 Encounter for screening for malignant neoplasm of colon: Secondary | ICD-10-CM | POA: Diagnosis not present

## 2016-10-08 DIAGNOSIS — Z79899 Other long term (current) drug therapy: Secondary | ICD-10-CM | POA: Diagnosis not present

## 2016-10-15 DIAGNOSIS — Z79899 Other long term (current) drug therapy: Secondary | ICD-10-CM | POA: Diagnosis not present

## 2016-10-15 DIAGNOSIS — Z0000 Encounter for general adult medical examination without abnormal findings: Secondary | ICD-10-CM | POA: Diagnosis not present

## 2016-10-15 DIAGNOSIS — E782 Mixed hyperlipidemia: Secondary | ICD-10-CM | POA: Diagnosis not present

## 2016-10-15 DIAGNOSIS — I10 Essential (primary) hypertension: Secondary | ICD-10-CM | POA: Diagnosis not present

## 2016-10-15 DIAGNOSIS — Z1211 Encounter for screening for malignant neoplasm of colon: Secondary | ICD-10-CM | POA: Diagnosis not present

## 2016-12-05 DIAGNOSIS — M65342 Trigger finger, left ring finger: Secondary | ICD-10-CM | POA: Diagnosis not present

## 2017-01-08 DIAGNOSIS — D692 Other nonthrombocytopenic purpura: Secondary | ICD-10-CM | POA: Diagnosis not present

## 2017-01-08 DIAGNOSIS — L578 Other skin changes due to chronic exposure to nonionizing radiation: Secondary | ICD-10-CM | POA: Diagnosis not present

## 2017-01-08 DIAGNOSIS — L918 Other hypertrophic disorders of the skin: Secondary | ICD-10-CM | POA: Diagnosis not present

## 2017-01-08 DIAGNOSIS — L820 Inflamed seborrheic keratosis: Secondary | ICD-10-CM | POA: Diagnosis not present

## 2017-01-08 DIAGNOSIS — L570 Actinic keratosis: Secondary | ICD-10-CM | POA: Diagnosis not present

## 2017-01-08 DIAGNOSIS — D229 Melanocytic nevi, unspecified: Secondary | ICD-10-CM | POA: Diagnosis not present

## 2017-01-08 DIAGNOSIS — L720 Epidermal cyst: Secondary | ICD-10-CM | POA: Diagnosis not present

## 2017-01-08 DIAGNOSIS — L821 Other seborrheic keratosis: Secondary | ICD-10-CM | POA: Diagnosis not present

## 2017-02-10 DIAGNOSIS — I25810 Atherosclerosis of coronary artery bypass graft(s) without angina pectoris: Secondary | ICD-10-CM | POA: Diagnosis not present

## 2017-02-10 DIAGNOSIS — R0602 Shortness of breath: Secondary | ICD-10-CM | POA: Diagnosis not present

## 2017-02-10 DIAGNOSIS — E782 Mixed hyperlipidemia: Secondary | ICD-10-CM | POA: Diagnosis not present

## 2017-02-10 DIAGNOSIS — I739 Peripheral vascular disease, unspecified: Secondary | ICD-10-CM | POA: Diagnosis not present

## 2017-02-10 DIAGNOSIS — I10 Essential (primary) hypertension: Secondary | ICD-10-CM | POA: Diagnosis not present

## 2017-02-10 DIAGNOSIS — G4734 Idiopathic sleep related nonobstructive alveolar hypoventilation: Secondary | ICD-10-CM | POA: Diagnosis not present

## 2017-03-04 DIAGNOSIS — R0602 Shortness of breath: Secondary | ICD-10-CM | POA: Diagnosis not present

## 2017-03-10 DIAGNOSIS — R0602 Shortness of breath: Secondary | ICD-10-CM | POA: Diagnosis not present

## 2017-03-19 DIAGNOSIS — I25810 Atherosclerosis of coronary artery bypass graft(s) without angina pectoris: Secondary | ICD-10-CM | POA: Diagnosis not present

## 2017-03-19 DIAGNOSIS — I10 Essential (primary) hypertension: Secondary | ICD-10-CM | POA: Diagnosis not present

## 2017-03-19 DIAGNOSIS — E782 Mixed hyperlipidemia: Secondary | ICD-10-CM | POA: Diagnosis not present

## 2017-03-19 DIAGNOSIS — G4733 Obstructive sleep apnea (adult) (pediatric): Secondary | ICD-10-CM | POA: Diagnosis not present

## 2017-04-10 DIAGNOSIS — I25810 Atherosclerosis of coronary artery bypass graft(s) without angina pectoris: Secondary | ICD-10-CM | POA: Diagnosis not present

## 2017-04-10 DIAGNOSIS — E782 Mixed hyperlipidemia: Secondary | ICD-10-CM | POA: Diagnosis not present

## 2017-04-10 DIAGNOSIS — I10 Essential (primary) hypertension: Secondary | ICD-10-CM | POA: Diagnosis not present

## 2017-04-10 DIAGNOSIS — Z0000 Encounter for general adult medical examination without abnormal findings: Secondary | ICD-10-CM | POA: Diagnosis not present

## 2017-04-10 DIAGNOSIS — Z79899 Other long term (current) drug therapy: Secondary | ICD-10-CM | POA: Diagnosis not present

## 2017-04-10 DIAGNOSIS — Z125 Encounter for screening for malignant neoplasm of prostate: Secondary | ICD-10-CM | POA: Diagnosis not present

## 2017-04-22 DIAGNOSIS — G4733 Obstructive sleep apnea (adult) (pediatric): Secondary | ICD-10-CM | POA: Diagnosis not present

## 2017-05-12 DIAGNOSIS — I739 Peripheral vascular disease, unspecified: Secondary | ICD-10-CM | POA: Diagnosis not present

## 2017-05-12 DIAGNOSIS — R0789 Other chest pain: Secondary | ICD-10-CM | POA: Diagnosis not present

## 2017-05-12 DIAGNOSIS — I10 Essential (primary) hypertension: Secondary | ICD-10-CM | POA: Diagnosis not present

## 2017-05-12 DIAGNOSIS — G4733 Obstructive sleep apnea (adult) (pediatric): Secondary | ICD-10-CM | POA: Diagnosis not present

## 2017-05-12 DIAGNOSIS — E782 Mixed hyperlipidemia: Secondary | ICD-10-CM | POA: Diagnosis not present

## 2017-05-12 DIAGNOSIS — I25810 Atherosclerosis of coronary artery bypass graft(s) without angina pectoris: Secondary | ICD-10-CM | POA: Diagnosis not present

## 2017-05-19 DIAGNOSIS — R400 Somnolence: Secondary | ICD-10-CM | POA: Diagnosis not present

## 2017-05-19 DIAGNOSIS — G4733 Obstructive sleep apnea (adult) (pediatric): Secondary | ICD-10-CM | POA: Diagnosis not present

## 2017-09-18 DIAGNOSIS — L570 Actinic keratosis: Secondary | ICD-10-CM | POA: Diagnosis not present

## 2017-09-18 DIAGNOSIS — L578 Other skin changes due to chronic exposure to nonionizing radiation: Secondary | ICD-10-CM | POA: Diagnosis not present

## 2017-09-18 DIAGNOSIS — L821 Other seborrheic keratosis: Secondary | ICD-10-CM | POA: Diagnosis not present

## 2017-10-09 DIAGNOSIS — Z1211 Encounter for screening for malignant neoplasm of colon: Secondary | ICD-10-CM | POA: Diagnosis not present

## 2017-10-09 DIAGNOSIS — I25810 Atherosclerosis of coronary artery bypass graft(s) without angina pectoris: Secondary | ICD-10-CM | POA: Diagnosis not present

## 2017-10-09 DIAGNOSIS — Z0000 Encounter for general adult medical examination without abnormal findings: Secondary | ICD-10-CM | POA: Diagnosis not present

## 2017-10-09 DIAGNOSIS — Z125 Encounter for screening for malignant neoplasm of prostate: Secondary | ICD-10-CM | POA: Diagnosis not present

## 2017-10-09 DIAGNOSIS — E782 Mixed hyperlipidemia: Secondary | ICD-10-CM | POA: Diagnosis not present

## 2017-10-09 DIAGNOSIS — I10 Essential (primary) hypertension: Secondary | ICD-10-CM | POA: Diagnosis not present

## 2017-10-09 DIAGNOSIS — Z79899 Other long term (current) drug therapy: Secondary | ICD-10-CM | POA: Diagnosis not present

## 2017-10-15 DIAGNOSIS — Z1211 Encounter for screening for malignant neoplasm of colon: Secondary | ICD-10-CM | POA: Diagnosis not present

## 2017-10-20 DIAGNOSIS — L570 Actinic keratosis: Secondary | ICD-10-CM | POA: Diagnosis not present

## 2017-10-22 DIAGNOSIS — R079 Chest pain, unspecified: Secondary | ICD-10-CM | POA: Diagnosis not present

## 2017-11-21 DIAGNOSIS — I25810 Atherosclerosis of coronary artery bypass graft(s) without angina pectoris: Secondary | ICD-10-CM | POA: Diagnosis not present

## 2017-11-21 DIAGNOSIS — I10 Essential (primary) hypertension: Secondary | ICD-10-CM | POA: Diagnosis not present

## 2017-11-21 DIAGNOSIS — G4733 Obstructive sleep apnea (adult) (pediatric): Secondary | ICD-10-CM | POA: Diagnosis not present

## 2017-11-21 DIAGNOSIS — E782 Mixed hyperlipidemia: Secondary | ICD-10-CM | POA: Diagnosis not present

## 2017-12-10 DIAGNOSIS — H353211 Exudative age-related macular degeneration, right eye, with active choroidal neovascularization: Secondary | ICD-10-CM | POA: Diagnosis not present

## 2017-12-11 DIAGNOSIS — H353211 Exudative age-related macular degeneration, right eye, with active choroidal neovascularization: Secondary | ICD-10-CM | POA: Diagnosis not present

## 2018-01-15 DIAGNOSIS — H353211 Exudative age-related macular degeneration, right eye, with active choroidal neovascularization: Secondary | ICD-10-CM | POA: Diagnosis not present

## 2018-02-24 DIAGNOSIS — M6530 Trigger finger, unspecified finger: Secondary | ICD-10-CM | POA: Diagnosis not present

## 2018-02-24 DIAGNOSIS — Z96653 Presence of artificial knee joint, bilateral: Secondary | ICD-10-CM | POA: Diagnosis not present

## 2018-02-26 DIAGNOSIS — H353211 Exudative age-related macular degeneration, right eye, with active choroidal neovascularization: Secondary | ICD-10-CM | POA: Diagnosis not present

## 2018-03-25 DIAGNOSIS — L821 Other seborrheic keratosis: Secondary | ICD-10-CM | POA: Diagnosis not present

## 2018-03-25 DIAGNOSIS — L578 Other skin changes due to chronic exposure to nonionizing radiation: Secondary | ICD-10-CM | POA: Diagnosis not present

## 2018-03-25 DIAGNOSIS — L570 Actinic keratosis: Secondary | ICD-10-CM | POA: Diagnosis not present

## 2018-04-09 DIAGNOSIS — H353211 Exudative age-related macular degeneration, right eye, with active choroidal neovascularization: Secondary | ICD-10-CM | POA: Diagnosis not present

## 2018-04-20 DIAGNOSIS — E782 Mixed hyperlipidemia: Secondary | ICD-10-CM | POA: Diagnosis not present

## 2018-04-20 DIAGNOSIS — Z0000 Encounter for general adult medical examination without abnormal findings: Secondary | ICD-10-CM | POA: Diagnosis not present

## 2018-04-20 DIAGNOSIS — Z125 Encounter for screening for malignant neoplasm of prostate: Secondary | ICD-10-CM | POA: Diagnosis not present

## 2018-04-20 DIAGNOSIS — Z79899 Other long term (current) drug therapy: Secondary | ICD-10-CM | POA: Diagnosis not present

## 2018-04-20 DIAGNOSIS — I10 Essential (primary) hypertension: Secondary | ICD-10-CM | POA: Diagnosis not present

## 2018-04-20 DIAGNOSIS — I25810 Atherosclerosis of coronary artery bypass graft(s) without angina pectoris: Secondary | ICD-10-CM | POA: Diagnosis not present

## 2018-10-14 DIAGNOSIS — R12 Heartburn: Secondary | ICD-10-CM | POA: Diagnosis not present

## 2018-10-14 DIAGNOSIS — K3184 Gastroparesis: Secondary | ICD-10-CM | POA: Diagnosis not present

## 2018-10-19 DIAGNOSIS — Z79899 Other long term (current) drug therapy: Secondary | ICD-10-CM | POA: Diagnosis not present

## 2018-10-19 DIAGNOSIS — E782 Mixed hyperlipidemia: Secondary | ICD-10-CM | POA: Diagnosis not present

## 2018-10-19 DIAGNOSIS — I10 Essential (primary) hypertension: Secondary | ICD-10-CM | POA: Diagnosis not present

## 2018-10-19 DIAGNOSIS — Z1211 Encounter for screening for malignant neoplasm of colon: Secondary | ICD-10-CM | POA: Diagnosis not present

## 2018-10-19 DIAGNOSIS — Z125 Encounter for screening for malignant neoplasm of prostate: Secondary | ICD-10-CM | POA: Diagnosis not present

## 2018-10-19 DIAGNOSIS — G5603 Carpal tunnel syndrome, bilateral upper limbs: Secondary | ICD-10-CM | POA: Diagnosis not present

## 2018-10-19 DIAGNOSIS — M25572 Pain in left ankle and joints of left foot: Secondary | ICD-10-CM | POA: Diagnosis not present

## 2018-10-19 DIAGNOSIS — I25810 Atherosclerosis of coronary artery bypass graft(s) without angina pectoris: Secondary | ICD-10-CM | POA: Diagnosis not present

## 2018-10-21 DIAGNOSIS — Z125 Encounter for screening for malignant neoplasm of prostate: Secondary | ICD-10-CM | POA: Diagnosis not present

## 2018-10-21 DIAGNOSIS — I25810 Atherosclerosis of coronary artery bypass graft(s) without angina pectoris: Secondary | ICD-10-CM | POA: Diagnosis not present

## 2018-10-21 DIAGNOSIS — Z79899 Other long term (current) drug therapy: Secondary | ICD-10-CM | POA: Diagnosis not present

## 2018-10-21 DIAGNOSIS — Z1211 Encounter for screening for malignant neoplasm of colon: Secondary | ICD-10-CM | POA: Diagnosis not present

## 2018-10-21 DIAGNOSIS — E782 Mixed hyperlipidemia: Secondary | ICD-10-CM | POA: Diagnosis not present

## 2018-10-21 DIAGNOSIS — M25572 Pain in left ankle and joints of left foot: Secondary | ICD-10-CM | POA: Diagnosis not present

## 2018-10-21 DIAGNOSIS — I10 Essential (primary) hypertension: Secondary | ICD-10-CM | POA: Diagnosis not present

## 2018-10-21 DIAGNOSIS — G5603 Carpal tunnel syndrome, bilateral upper limbs: Secondary | ICD-10-CM | POA: Diagnosis not present

## 2018-10-22 DIAGNOSIS — H353211 Exudative age-related macular degeneration, right eye, with active choroidal neovascularization: Secondary | ICD-10-CM | POA: Diagnosis not present

## 2018-10-28 DIAGNOSIS — Z1211 Encounter for screening for malignant neoplasm of colon: Secondary | ICD-10-CM | POA: Diagnosis not present

## 2018-11-03 DIAGNOSIS — M25572 Pain in left ankle and joints of left foot: Secondary | ICD-10-CM | POA: Diagnosis not present

## 2018-11-03 DIAGNOSIS — M65872 Other synovitis and tenosynovitis, left ankle and foot: Secondary | ICD-10-CM | POA: Diagnosis not present

## 2018-11-24 DIAGNOSIS — M65872 Other synovitis and tenosynovitis, left ankle and foot: Secondary | ICD-10-CM | POA: Diagnosis not present

## 2018-11-30 DIAGNOSIS — I25810 Atherosclerosis of coronary artery bypass graft(s) without angina pectoris: Secondary | ICD-10-CM | POA: Diagnosis not present

## 2018-11-30 DIAGNOSIS — G4733 Obstructive sleep apnea (adult) (pediatric): Secondary | ICD-10-CM | POA: Diagnosis not present

## 2018-11-30 DIAGNOSIS — I10 Essential (primary) hypertension: Secondary | ICD-10-CM | POA: Diagnosis not present

## 2018-11-30 DIAGNOSIS — E782 Mixed hyperlipidemia: Secondary | ICD-10-CM | POA: Diagnosis not present

## 2018-12-10 DIAGNOSIS — H353211 Exudative age-related macular degeneration, right eye, with active choroidal neovascularization: Secondary | ICD-10-CM | POA: Diagnosis not present

## 2019-02-04 DIAGNOSIS — H353211 Exudative age-related macular degeneration, right eye, with active choroidal neovascularization: Secondary | ICD-10-CM | POA: Diagnosis not present

## 2019-03-25 ENCOUNTER — Emergency Department: Payer: Medicare HMO

## 2019-03-25 ENCOUNTER — Other Ambulatory Visit: Payer: Self-pay

## 2019-03-25 ENCOUNTER — Observation Stay
Admission: EM | Admit: 2019-03-25 | Discharge: 2019-03-26 | Disposition: A | Discharge status: Home / Self Care | Payer: Medicare HMO | Attending: Internal Medicine | Admitting: Internal Medicine

## 2019-03-25 ENCOUNTER — Encounter: Payer: Self-pay | Admitting: Emergency Medicine

## 2019-03-25 DIAGNOSIS — M1990 Unspecified osteoarthritis, unspecified site: Secondary | ICD-10-CM | POA: Diagnosis not present

## 2019-03-25 DIAGNOSIS — Z7982 Long term (current) use of aspirin: Secondary | ICD-10-CM | POA: Diagnosis not present

## 2019-03-25 DIAGNOSIS — N400 Enlarged prostate without lower urinary tract symptoms: Secondary | ICD-10-CM | POA: Insufficient documentation

## 2019-03-25 DIAGNOSIS — Z87891 Personal history of nicotine dependence: Secondary | ICD-10-CM | POA: Insufficient documentation

## 2019-03-25 DIAGNOSIS — Z79899 Other long term (current) drug therapy: Secondary | ICD-10-CM | POA: Insufficient documentation

## 2019-03-25 DIAGNOSIS — R55 Syncope and collapse: Secondary | ICD-10-CM | POA: Diagnosis not present

## 2019-03-25 DIAGNOSIS — Z961 Presence of intraocular lens: Secondary | ICD-10-CM | POA: Diagnosis not present

## 2019-03-25 DIAGNOSIS — I700 Atherosclerosis of aorta: Secondary | ICD-10-CM | POA: Insufficient documentation

## 2019-03-25 DIAGNOSIS — Z951 Presence of aortocoronary bypass graft: Secondary | ICD-10-CM | POA: Insufficient documentation

## 2019-03-25 DIAGNOSIS — N179 Acute kidney failure, unspecified: Secondary | ICD-10-CM | POA: Diagnosis not present

## 2019-03-25 DIAGNOSIS — G629 Polyneuropathy, unspecified: Secondary | ICD-10-CM | POA: Insufficient documentation

## 2019-03-25 DIAGNOSIS — M4802 Spinal stenosis, cervical region: Secondary | ICD-10-CM | POA: Diagnosis not present

## 2019-03-25 DIAGNOSIS — G8929 Other chronic pain: Secondary | ICD-10-CM | POA: Diagnosis not present

## 2019-03-25 DIAGNOSIS — I2510 Atherosclerotic heart disease of native coronary artery without angina pectoris: Secondary | ICD-10-CM | POA: Insufficient documentation

## 2019-03-25 DIAGNOSIS — Z9842 Cataract extraction status, left eye: Secondary | ICD-10-CM | POA: Diagnosis not present

## 2019-03-25 DIAGNOSIS — I10 Essential (primary) hypertension: Secondary | ICD-10-CM | POA: Diagnosis not present

## 2019-03-25 DIAGNOSIS — Z9841 Cataract extraction status, right eye: Secondary | ICD-10-CM | POA: Diagnosis not present

## 2019-03-25 DIAGNOSIS — I6782 Cerebral ischemia: Secondary | ICD-10-CM | POA: Insufficient documentation

## 2019-03-25 DIAGNOSIS — Z96653 Presence of artificial knee joint, bilateral: Secondary | ICD-10-CM | POA: Diagnosis not present

## 2019-03-25 DIAGNOSIS — W19XXXA Unspecified fall, initial encounter: Secondary | ICD-10-CM | POA: Insufficient documentation

## 2019-03-25 DIAGNOSIS — S299XXA Unspecified injury of thorax, initial encounter: Secondary | ICD-10-CM | POA: Diagnosis not present

## 2019-03-25 DIAGNOSIS — G4733 Obstructive sleep apnea (adult) (pediatric): Secondary | ICD-10-CM | POA: Diagnosis not present

## 2019-03-25 DIAGNOSIS — E785 Hyperlipidemia, unspecified: Secondary | ICD-10-CM

## 2019-03-25 DIAGNOSIS — Z9989 Dependence on other enabling machines and devices: Secondary | ICD-10-CM

## 2019-03-25 DIAGNOSIS — K219 Gastro-esophageal reflux disease without esophagitis: Secondary | ICD-10-CM | POA: Diagnosis not present

## 2019-03-25 DIAGNOSIS — Z20828 Contact with and (suspected) exposure to other viral communicable diseases: Secondary | ICD-10-CM | POA: Insufficient documentation

## 2019-03-25 LAB — COMPREHENSIVE METABOLIC PANEL
ALT: 25 U/L (ref 0–44)
AST: 26 U/L (ref 15–41)
Albumin: 4.4 g/dL (ref 3.5–5.0)
Alkaline Phosphatase: 77 U/L (ref 38–126)
Anion gap: 11 (ref 5–15)
BUN: 16 mg/dL (ref 8–23)
CO2: 26 mmol/L (ref 22–32)
Calcium: 9.3 mg/dL (ref 8.9–10.3)
Chloride: 101 mmol/L (ref 98–111)
Creatinine, Ser: 1.34 mg/dL — ABNORMAL HIGH (ref 0.61–1.24)
GFR calc Af Amer: 55 mL/min — ABNORMAL LOW (ref >60)
GFR calc non Af Amer: 48 mL/min — ABNORMAL LOW (ref >60)
Glucose, Bld: 111 mg/dL — ABNORMAL HIGH (ref 70–99)
Potassium: 3.9 mmol/L (ref 3.5–5.1)
Sodium: 138 mmol/L (ref 135–145)
Total Bilirubin: 1 mg/dL (ref 0.3–1.2)
Total Protein: 7.1 g/dL (ref 6.5–8.1)

## 2019-03-25 LAB — CBC WITH DIFFERENTIAL/PLATELET
Abs Immature Granulocytes: 0.03 10*3/uL (ref 0.00–0.07)
Basophils Absolute: 0.1 10*3/uL (ref 0.0–0.1)
Basophils Relative: 1 %
Eosinophils Absolute: 0.2 10*3/uL (ref 0.0–0.5)
Eosinophils Relative: 2 %
HCT: 39.9 % (ref 39.0–52.0)
Hemoglobin: 13.6 g/dL (ref 13.0–17.0)
Immature Granulocytes: 0 %
Lymphocytes Relative: 41 %
Lymphs Abs: 3.6 10*3/uL (ref 0.7–4.0)
MCH: 29.6 pg (ref 26.0–34.0)
MCHC: 34.1 g/dL (ref 30.0–36.0)
MCV: 86.9 fL (ref 80.0–100.0)
Monocytes Absolute: 0.6 10*3/uL (ref 0.1–1.0)
Monocytes Relative: 7 %
Neutro Abs: 4.4 10*3/uL (ref 1.7–7.7)
Neutrophils Relative %: 49 %
Platelets: 139 10*3/uL — ABNORMAL LOW (ref 150–400)
RBC: 4.59 MIL/uL (ref 4.22–5.81)
RDW: 12.2 % (ref 11.5–15.5)
WBC: 8.9 10*3/uL (ref 4.0–10.5)
nRBC: 0 % (ref 0.0–0.2)

## 2019-03-25 LAB — URINALYSIS, ROUTINE W REFLEX MICROSCOPIC
Bilirubin Urine: NEGATIVE
Glucose, UA: NEGATIVE mg/dL
Hgb urine dipstick: NEGATIVE
Ketones, ur: NEGATIVE mg/dL
Leukocytes,Ua: NEGATIVE
Nitrite: NEGATIVE
Protein, ur: NEGATIVE mg/dL
Specific Gravity, Urine: 1.01 (ref 1.005–1.030)
pH: 5 (ref 5.0–8.0)

## 2019-03-25 LAB — TROPONIN I (HIGH SENSITIVITY)
Troponin I (High Sensitivity): 3 ng/L (ref <18)
Troponin I (High Sensitivity): 3 ng/L (ref <18)

## 2019-03-25 LAB — TSH: TSH: 2.351 u[IU]/mL (ref 0.350–4.500)

## 2019-03-25 MED ORDER — PANTOPRAZOLE SODIUM 40 MG PO TBEC
40.0000 mg | DELAYED_RELEASE_TABLET | Freq: Two times a day (BID) | ORAL | Status: DC
  Administered 2019-03-25 – 2019-03-26 (×2): 40 mg via ORAL
  Filled 2019-03-25 (×2): qty 1

## 2019-03-25 MED ORDER — GABAPENTIN 100 MG PO CAPS
100.0000 mg | ORAL_CAPSULE | Freq: Three times a day (TID) | ORAL | Status: DC
  Administered 2019-03-25 – 2019-03-26 (×2): 100 mg via ORAL
  Filled 2019-03-25 (×2): qty 1

## 2019-03-25 MED ORDER — METOPROLOL TARTRATE 25 MG PO TABS
25.0000 mg | ORAL_TABLET | Freq: Two times a day (BID) | ORAL | Status: DC
  Administered 2019-03-25 – 2019-03-26 (×2): 25 mg via ORAL
  Filled 2019-03-25 (×2): qty 1

## 2019-03-25 MED ORDER — TAMSULOSIN HCL 0.4 MG PO CAPS
0.4000 mg | ORAL_CAPSULE | Freq: Every day | ORAL | Status: DC
  Administered 2019-03-26: 0.4 mg via ORAL
  Filled 2019-03-25: qty 1

## 2019-03-25 MED ORDER — ASPIRIN EC 81 MG PO TBEC
81.0000 mg | DELAYED_RELEASE_TABLET | Freq: Every day | ORAL | Status: DC
  Administered 2019-03-25 – 2019-03-26 (×2): 81 mg via ORAL
  Filled 2019-03-25 (×2): qty 1

## 2019-03-25 MED ORDER — SODIUM CHLORIDE 0.9 % IV SOLN
INTRAVENOUS | Status: DC
  Administered 2019-03-25: 23:00:00 via INTRAVENOUS

## 2019-03-25 MED ORDER — CALCIUM CARBONATE-VITAMIN D 500-200 MG-UNIT PO TABS
1.0000 | ORAL_TABLET | Freq: Every day | ORAL | Status: DC
  Administered 2019-03-26: 1 via ORAL
  Filled 2019-03-25: qty 1

## 2019-03-25 MED ORDER — SIMVASTATIN 20 MG PO TABS
40.0000 mg | ORAL_TABLET | Freq: Every day | ORAL | Status: DC
  Administered 2019-03-25: 40 mg via ORAL
  Filled 2019-03-25: qty 2

## 2019-03-25 MED ORDER — MAGNESIUM OXIDE 400 (241.3 MG) MG PO TABS
600.0000 mg | ORAL_TABLET | Freq: Every day | ORAL | Status: DC
  Administered 2019-03-26: 600 mg via ORAL
  Filled 2019-03-25: qty 2

## 2019-03-25 MED ORDER — METOCLOPRAMIDE HCL 10 MG PO TABS
10.0000 mg | ORAL_TABLET | Freq: Every day | ORAL | Status: DC
  Administered 2019-03-26: 10 mg via ORAL
  Filled 2019-03-25: qty 1

## 2019-03-25 MED ORDER — ENOXAPARIN SODIUM 40 MG/0.4ML ~~LOC~~ SOLN
40.0000 mg | SUBCUTANEOUS | Status: DC
  Administered 2019-03-25: 40 mg via SUBCUTANEOUS
  Filled 2019-03-25: qty 0.4

## 2019-03-25 MED ORDER — VITAMIN B-12 1000 MCG PO TABS
1000.0000 ug | ORAL_TABLET | Freq: Every day | ORAL | Status: DC
  Administered 2019-03-26: 1000 ug via ORAL
  Filled 2019-03-25: qty 1

## 2019-03-25 NOTE — Progress Notes (Signed)
Patient has declined cpap. States does not use one at home.

## 2019-03-25 NOTE — ED Notes (Signed)
Pt assisted to bathroom. Pt ambulatory with steady gait. Pt assisted back to bed. Urine specimen collected and informed Chrissy that sample just needs label.

## 2019-03-25 NOTE — ED Triage Notes (Signed)
Pt arrival via [facility] from home due to fall. EMS states that pt's daughter was at the house helping the pt wash windows and he offered to help. Daughter then heard crash in other room and found pt on the floor on his back.  Pt denies remembering the fall and was altered with EMS.   Pt A&O x4 with [place]. EDP at bedside.   Daughter also states that the patient has been experiencing some facial droop & slurred speech for the last week on and off. Last occurrence unknown.

## 2019-03-25 NOTE — H&P (Signed)
History and Physical    Robert Reynolds [id number] DOB: March 09, 1931 DOA: 03/25/2019  PCP: Idelle Crouch, MD  Patient coming from: Home, daughter at bedside  I have personally briefly reviewed patient's old medical records in [name]  Chief Complaint: Syncope  HPI: Robert Reynolds is a 85 y.o. male with medical history significant of CAD s/p CABG, hypertension, OSA, hyperlipidemia who presents for a syncopal episode.  Patient today was cleaning at home with his daughter.  He went to another room and remembers taking 1 step up a stepstool and the next thing he could remember was being surrounded by EMS.  He denies any lightheadedness or dizziness prior.  No chest pain, shortness of breath or palpitation.  Denies any extremity weakness.  Daughter thought that he appeared confused after he regained consciousness and it took him about 10 minutes until he was more aware of his surroundings.  Daughter denies seeing any seizure-like activities.  No bowel or bladder incontinent.  Patient has not had any history of prior syncopal episodes.  He has been eating and hydrating well and states he was in his otherwise normal state of health.  Patient goes to [facility] to [facility] daily.  Reports compliance with his medication. His only concern now is some slight left posterior neck soreness. He denies any tobacco, alcohol or illicit drug use.  ED Course: He was afebrile and normotensive on room air.  CBC shows no leukocytosis or anemia.  BMP shows glucose 111 and creatinine of 1.34.  Troponin is flat at 3x2. EKG shows NSR with PVCs and RBBB. TSH is within normal limits.  Urinalysis is negative. CT head shows no acute intracranial abnormalities. CT cervical spine shows moderate to severe multilevel degenerative changes throughout the cervical spine but no acute fractures or traumatic listhesis.  Review of Systems:  Constitutional: No Weight Change, No Fever ENT/Mouth: No sore throat, No  Rhinorrhea Eyes: No Eye Pain, No Vision Changes Cardiovascular: No Chest Pain, no SOB, No Palpitations Respiratory: No Cough, No Sputum, No Wheezing, no Dyspnea  Gastrointestinal: No Nausea, No Vomiting, No Diarrhea, No Constipation, No Pain Genitourinary: no Urinary Incontinence, No Urgency, No Flank Pain Musculoskeletal: No Arthralgias, No Myalgias Skin: No Skin Lesions, No Pruritus, Neuro: no Weakness, No Numbness,  + Loss of Consciousness, + Syncope Psych: No Anxiety/Panic, No Depression, no decrease appetite Heme/Lymph: No Bruising, No Bleeding  Past Medical History:  Diagnosis Date   Arthritis    BPH (benign prostatic hyperplasia)    Coronary artery disease    GERD (gastroesophageal reflux disease)    Hypertension    Hypothyroidism    Neuropathy    Shortness of breath dyspnea    DOE    Past Surgical History:  Procedure Laterality Date   CATARACT EXTRACTION W/PHACO Right 12/21/2015   Procedure: CATARACT EXTRACTION PHACO AND INTRAOCULAR LENS PLACEMENT (IOC);  Surgeon: Eulogio Bear, MD;  Location: [facility] ORS;  Service: Ophthalmology;  Laterality: Right;  [place] 1.13 AP% 13.3 CDE 9.81 Fluid Pack Lot # [id number]   CATARACT EXTRACTION W/PHACO Left 01/18/2016   Procedure: CATARACT EXTRACTION PHACO AND INTRAOCULAR LENS PLACEMENT (IOC);  Surgeon: Eulogio Bear, MD;  Location: [facility] ORS;  Service: Ophthalmology;  Laterality: Left;  [place] 44.9 AP% 11.03 CDE 5.07 Fluid Pack Lot # [id number]   CORONARY ARTERY BYPASS GRAFT     2011   CTR Bilateral    JOINT REPLACEMENT     TKR BIL     reports that he has  quit smoking. He has never used smokeless tobacco. He reports that he does not drink alcohol. No history on file for drug.  No Known Allergies    Prior to Admission medications   Medication Sig Start Date End Date Taking? Authorizing Provider  acetaminophen (TYLENOL) 500 MG tablet Take 500 mg by mouth every 6 (six) hours as needed for mild pain.    [provider]  aspirin 81 MG tablet Take 81 mg by mouth daily.    [provider]  gabapentin (NEURONTIN) 100 MG capsule Take 100 mg by mouth 3 (three) times daily.    [provider]  MAGNESIUM PO Take 1 tablet by mouth daily.    [provider]  methocarbamol (ROBAXIN) 750 MG tablet Take 750 mg by mouth 4 (four) times daily as needed for muscle spasms.     [provider]  metoCLOPramide (REGLAN) 10 MG tablet Take 10 mg by mouth daily.    [provider]  metoprolol tartrate (LOPRESSOR) 25 MG tablet Take 25 mg by mouth 2 (two) times daily.    [provider]  multivitamin-lutein (OCUVITE-LUTEIN) CAPS capsule Take 1 capsule by mouth daily.    [provider]  nabumetone (RELAFEN) 500 MG tablet Take 500 mg by mouth 2 (two) times daily.    [provider]  pantoprazole (PROTONIX) 40 MG tablet Take 40 mg by mouth daily.    [provider]  simvastatin (ZOCOR) 40 MG tablet Take 40 mg by mouth daily.    [provider]  tamsulosin (FLOMAX) 0.4 MG CAPS capsule Take 0.4 mg by mouth daily.    [provider]  vitamin B-12 (CYANOCOBALAMIN) 100 MCG tablet Take 100 mcg by mouth daily.    [provider]    Physical Exam: Vitals:   03/25/19 1617 03/25/19 1624 03/25/19 1756  BP: 131/77  133/86  Pulse: 65  (!) 58  Resp: 18  15  Temp: 98.2 F (36.8 C)    SpO2: 98%  98%  Weight:  95.3 kg   Height:  5\' 8"  (1.727 m)     Constitutional: NAD, calm, comfortable, well-appearing nontoxic gentleman sitting upright in bed Vitals:   03/25/19 1617 03/25/19 1624 03/25/19 1756  BP: 131/77  133/86  Pulse: 65  (!) 58  Resp: 18  15  Temp: 98.2 F (36.8 C)    SpO2: 98%  98%  Weight:  95.3 kg   Height:  5\' 8"  (1.727 m)    Eyes: PERRL, lids and conjunctivae normal ENMT: Mucous membranes are moist. Posterior pharynx clear of any exudate or lesions. Neck: normal, supple, no masses, mild tenderness to  palpation of the posterior cervical region with no obvious deformities or ecchymosis. Respiratory: clear to auscultation bilaterally, no wheezing, no crackles. Normal respiratory effort. No accessory muscle use.  Cardiovascular: Regular rate and rhythm, no murmurs / rubs / gallops. No extremity edema. 2+ pedal pulses.  Abdomen: no tenderness, no masses palpated.  Bowel sounds positive.  Musculoskeletal: no clubbing / cyanosis. No joint deformity upper and lower extremities. Good ROM, no contractures. Normal muscle tone.  Skin: no rashes, lesions, ulcers. No induration Neurologic: CN 2-12 grossly intact. Sensation intact. Strength 5/5 in all 4.  Psychiatric: Normal judgment and insight. Alert and oriented x 3. Normal mood.     Labs on Admission: I have personally reviewed following labs and imaging studies  CBC: Recent Labs  Lab 03/25/19 1619  WBC 8.9  NEUTROABS 4.4  HGB 13.6  HCT 39.9  MCV 86.9  PLT [id number]*   Basic Metabolic Panel: Recent Labs  Lab 03/25/19 1619  NA 138  K 3.9  CL 101  CO2 26  GLUCOSE 111*  BUN 16  CREATININE 1.34*  CALCIUM 9.3   GFR: Estimated Creatinine Clearance: 44.3 mL/min (A) (by C-G formula based on SCr of 1.34 mg/dL (H)). Liver Function Tests: Recent Labs  Lab 03/25/19 1619  AST 26  ALT 25  ALKPHOS 77  BILITOT 1.0  PROT 7.1  ALBUMIN 4.4   No results for input(s): LIPASE, AMYLASE in the last 168 hours. No results for input(s): AMMONIA in the last 168 hours. Coagulation Profile: No results for input(s): INR, PROTIME in the last 168 hours. Cardiac Enzymes: No results for input(s): CKTOTAL, CKMB, CKMBINDEX, TROPONINI in the last 168 hours. BNP (last 3 results) No results for input(s): PROBNP in the last 8760 hours. HbA1C: No results for input(s): HGBA1C in the last 72 hours. CBG: No results for input(s): GLUCAP in the last 168 hours. Lipid Profile: No results for input(s): CHOL, HDL, LDLCALC, TRIG, CHOLHDL, LDLDIRECT in the last 72  hours. Thyroid Function Tests: No results for input(s): TSH, T4TOTAL, FREET4, T3FREE, THYROIDAB in the last 72 hours. Anemia Panel: No results for input(s): VITAMINB12, FOLATE, FERRITIN, TIBC, IRON, RETICCTPCT in the last 72 hours. Urine analysis:    Component Value Date/Time   COLORURINE YELLOW (A) 03/25/2019 1809   APPEARANCEUR CLEAR (A) 03/25/2019 1809   LABSPEC 1.010 03/25/2019 1809   PHURINE 5.0 03/25/2019 1809   GLUCOSEU NEGATIVE 03/25/2019 1809   HGBUR NEGATIVE 03/25/2019 1809   BILIRUBINUR NEGATIVE 03/25/2019 1809   KETONESUR NEGATIVE 03/25/2019 1809   PROTEINUR NEGATIVE 03/25/2019 1809   NITRITE NEGATIVE 03/25/2019 1809   LEUKOCYTESUR NEGATIVE 03/25/2019 1809    Radiological Exams on Admission: Ct Head Wo Contrast  Result Date: 03/25/2019 CLINICAL DATA:  Syncope while standing on a stool, unsure of head strike EXAM: CT HEAD WITHOUT CONTRAST CT CERVICAL SPINE WITHOUT CONTRAST TECHNIQUE: Multidetector CT imaging of the head and cervical spine was performed following the standard protocol without intravenous contrast. Multiplanar CT image reconstructions of the cervical spine were also generated. COMPARISON:  C-spine MRI 11/27/2006 FINDINGS: CT HEAD FINDINGS Brain: No evidence of acute infarction, hemorrhage, hydrocephalus, extra-axial collection or mass lesion/mass effect. Symmetric prominence of the ventricles, cisterns and sulci compatible with some frontal predominant parenchymal volume loss. Patchy areas of white matter hypoattenuation are most compatible with chronic microvascular angiopathy. Vascular: Atherosclerotic calcification of the carotid siphons. No hyperdense vessel. Skull: No calvarial fracture or suspicious osseous lesion. No scalp swelling or hematoma. Sinuses/Orbits: Paranasal sinuses and mastoid air cells are predominantly clear. Orbital structures are unremarkable aside from prior lens extractions. Other: Mild TMJ arthrosis. CT CERVICAL SPINE FINDINGS Alignment:  Preservation of the normal cervical lordosis without traumatic listhesis. No abnormal facet widening. Normal alignment of the craniocervical and atlantoaxial articulations. Skull base and vertebrae: No acute fracture. No primary bone lesion or focal pathologic process. Calcified pannus formation upon the dens. Soft tissues and spinal canal: No pre or paravertebral fluid or swelling. No visible canal hematoma. Disc levels: Multilevel intervertebral disc height loss with spondylitic endplate changes. Posterior ridging results in mild to moderate multilevel canal stenosis most pronounced at C5-6. Diffuse uncinate spurring and facet hypertrophic changes results in multilevel mild-to-moderate neural foraminal narrowing. Upper chest: No acute abnormality in the upper chest or imaged lung apices. Emphysematous changes noted in the lung apices. Other: Normal thyroid. Atherosclerosis of the cervical  carotid arteries and proximal left subclavian artery. IMPRESSION: 1. No acute intracranial abnormality. 2. Mild frontal predominant parenchymal volume loss and chronic microvascular ischemic white matter disease. 3. No acute cervical spine fracture or traumatic listhesis. 4. Moderate to severe multilevel degenerative changes throughout the cervical spine. Electronically Signed   By: Lovena Le M.D.   On: 03/25/2019 17:06   Ct Cervical Spine Wo Contrast  Result Date: 03/25/2019 CLINICAL DATA:  Syncope while standing on a stool, unsure of head strike EXAM: CT HEAD WITHOUT CONTRAST CT CERVICAL SPINE WITHOUT CONTRAST TECHNIQUE: Multidetector CT imaging of the head and cervical spine was performed following the standard protocol without intravenous contrast. Multiplanar CT image reconstructions of the cervical spine were also generated. COMPARISON:  C-spine MRI 11/27/2006 FINDINGS: CT HEAD FINDINGS Brain: No evidence of acute infarction, hemorrhage, hydrocephalus, extra-axial collection or mass lesion/mass effect. Symmetric  prominence of the ventricles, cisterns and sulci compatible with some frontal predominant parenchymal volume loss. Patchy areas of white matter hypoattenuation are most compatible with chronic microvascular angiopathy. Vascular: Atherosclerotic calcification of the carotid siphons. No hyperdense vessel. Skull: No calvarial fracture or suspicious osseous lesion. No scalp swelling or hematoma. Sinuses/Orbits: Paranasal sinuses and mastoid air cells are predominantly clear. Orbital structures are unremarkable aside from prior lens extractions. Other: Mild TMJ arthrosis. CT CERVICAL SPINE FINDINGS Alignment: Preservation of the normal cervical lordosis without traumatic listhesis. No abnormal facet widening. Normal alignment of the craniocervical and atlantoaxial articulations. Skull base and vertebrae: No acute fracture. No primary bone lesion or focal pathologic process. Calcified pannus formation upon the dens. Soft tissues and spinal canal: No pre or paravertebral fluid or swelling. No visible canal hematoma. Disc levels: Multilevel intervertebral disc height loss with spondylitic endplate changes. Posterior ridging results in mild to moderate multilevel canal stenosis most pronounced at C5-6. Diffuse uncinate spurring and facet hypertrophic changes results in multilevel mild-to-moderate neural foraminal narrowing. Upper chest: No acute abnormality in the upper chest or imaged lung apices. Emphysematous changes noted in the lung apices. Other: Normal thyroid. Atherosclerosis of the cervical carotid arteries and proximal left subclavian artery. IMPRESSION: 1. No acute intracranial abnormality. 2. Mild frontal predominant parenchymal volume loss and chronic microvascular ischemic white matter disease. 3. No acute cervical spine fracture or traumatic listhesis. 4. Moderate to severe multilevel degenerative changes throughout the cervical spine. Electronically Signed   By: Lovena Le M.D.   On: 03/25/2019 17:06   Dg  Chest Portable 1 View  Result Date: 03/25/2019 CLINICAL DATA:  Fall. EXAM: PORTABLE CHEST 1 VIEW COMPARISON:  Report from chest radiograph 05/14/2018 (images unavailable) FINDINGS: Heart size within normal limits.  Aortic atherosclerosis. No airspace consolidation. No evidence of pneumothorax or sizable pleural effusion. No displaced fracture is identified. Thoracic dextrocurvature. Surgical clips project over the mediastinum and right axilla. IMPRESSION: No airspace consolidation.  No evidence of pneumothorax. Thoracic dextrocurvature. Aortic atherosclerosis. Electronically Signed   By: Kellie Simmering DO   On: 03/25/2019 16:38    EKG: Independently reviewed.   Assessment/Plan Syncope -Patient had loss of consciousness without any prodrome.  Although likely could be due to hypovolemia given AKI or polypharmacy since he takes a muscle relaxer TID -keep on telemetry -Check orthostatic vital signs -Check echocardiogram. Last echo in 02/2017 reportedly shows normal LV function  AKI -Admission creatinine of 1.34 from a prior of 1.2 in 04/2018 - start IV NS fluids - recheck BMP in the morning   CAD s/p CABG - continue aspirin  Hypertension - continue metoprolol  Hyperlipidemia - continue simvastatin   OSA -Continue CPAP  GERD -continue PPI and reglan  Chronic arthritic pain - hold nabumetone given AKI - continue gabapentin - hold Tizandine TID- this could be contributory to his syncopal episode  DVT prophylaxis:.Lovenox Code Status: Full Family Communication: Plan discussed with patient and daughter at bedside.  All questions and concerns addressed. disposition Plan: Home with observation Consults called:  Admission status: Observation   Melayah Skorupski T Kayvan Hoefling DO [facility]   If 7PM-7AM, please contact night-coverage [email or web address] Password [facility]  03/25/2019, 6:39 PM

## 2019-03-25 NOTE — ED Provider Notes (Signed)
[facility] Emergency Department Provider Note  ____________________________________________   First MD Initiated Contact with Patient 03/25/19 1616     (approximate)  I have reviewed the triage vital signs and the nursing notes.   HISTORY  Chief Complaint Fall    HPI Robert Reynolds is a 85 y.o. male with hypertension, hyperlipidemia, coronary disease status post CABG 3 years ago who presents with fall.  Patient was helping his daughter clean the windows when he had LOC.  The daughter heard a thump and she went over and found him laying on the ground.  He does not remember why he fell or any of the events leading up to it or shortly after it.  He denies any current chest pain or shortness of breath.  He denies any abdominal pain.  He says he had a little bit of neck pain while in the EMS that was mild, constant, worse with moving but has since resolved.  He says he feels little groggy but otherwise at baseline.  He does not have a pacemaker.  He does not have urinary symptoms.  Denies prior syncope.  Denies seizure-like activity.  Syncope occurred 1 time, severe, unclear what brought it on, better on his own.          Past Medical History:  Diagnosis Date   Arthritis    BPH (benign prostatic hyperplasia)    Coronary artery disease    GERD (gastroesophageal reflux disease)    Hypertension    Hypothyroidism    Neuropathy (HCC)    Shortness of breath dyspnea    DOE    There are no active problems to display for this patient.   Past Surgical History:  Procedure Laterality Date   CATARACT EXTRACTION W/PHACO Right 12/21/2015   Procedure: CATARACT EXTRACTION PHACO AND INTRAOCULAR LENS PLACEMENT (IOC);  Surgeon: Eulogio Bear, MD;  Location: [facility] ORS;  Service: Ophthalmology;  Laterality: Right;  [place] 1.13 AP% 13.3 CDE 9.81 Fluid Pack Lot # [id number]   CATARACT EXTRACTION W/PHACO Left 01/18/2016   Procedure: CATARACT EXTRACTION PHACO AND  INTRAOCULAR LENS PLACEMENT (IOC);  Surgeon: Eulogio Bear, MD;  Location: [facility] ORS;  Service: Ophthalmology;  Laterality: Left;  [place] 44.9 AP% 11.03 CDE 5.07 Fluid Pack Lot # [id number]   CORONARY ARTERY BYPASS GRAFT     2011   CTR Bilateral    JOINT REPLACEMENT     TKR BIL    Prior to Admission medications   Medication Sig Start Date End Date Taking? Authorizing Provider  acetaminophen (TYLENOL) 500 MG tablet Take 500 mg by mouth every 6 (six) hours as needed for mild pain.    [provider]  aspirin 81 MG tablet Take 81 mg by mouth daily.    [provider]  gabapentin (NEURONTIN) 100 MG capsule Take 100 mg by mouth 3 (three) times daily.    [provider]  MAGNESIUM PO Take 1 tablet by mouth daily.    [provider]  methocarbamol (ROBAXIN) 750 MG tablet Take 750 mg by mouth 4 (four) times daily as needed for muscle spasms.     [provider]  metoCLOPramide (REGLAN) 10 MG tablet Take 10 mg by mouth daily.    [provider]  metoprolol tartrate (LOPRESSOR) 25 MG tablet Take 25 mg by mouth 2 (two) times daily.    [provider]  multivitamin-lutein (OCUVITE-LUTEIN) CAPS capsule Take 1 capsule by mouth daily.    [provider]  nabumetone (  RELAFEN) 500 MG tablet Take 500 mg by mouth 2 (two) times daily.    [provider]  pantoprazole (PROTONIX) 40 MG tablet Take 40 mg by mouth daily.    [provider]  simvastatin (ZOCOR) 40 MG tablet Take 40 mg by mouth daily.    [provider]  tamsulosin (FLOMAX) 0.4 MG CAPS capsule Take 0.4 mg by mouth daily.    [provider]  vitamin B-12 (CYANOCOBALAMIN) 100 MCG tablet Take 100 mcg by mouth daily.    [provider]    Allergies Patient has no known allergies.  No family history on file.  Social History Social History   Tobacco Use   Smoking status: Former Smoker   Smokeless tobacco: Never Used  Substance  Use Topics   Alcohol use: No   Drug use: Not on file      Review of Systems Constitutional: No fever/chills, syncope Eyes: No visual changes. ENT: No sore throat. Cardiovascular: Denies chest pain. Respiratory: Denies shortness of breath. Gastrointestinal: No abdominal pain.  No nausea, no vomiting.  No diarrhea.  No constipation. Genitourinary: Negative for dysuria. Musculoskeletal: Negative for back pain. Skin: Negative for rash. Neurological: Negative for headaches, focal weakness or numbness.  Neck pain now resolved All other ROS negative ____________________________________________   PHYSICAL EXAM:  VITAL SIGNS: Blood pressure 133/86, pulse (!) 58, temperature 98.2 F (36.8 C), resp. rate 15, height 5\' 8"  (1.727 m), weight 95.3 kg, SpO2 98 %.  Constitutional: Alert and oriented. GCS 15  Eyes: Conjunctivae are normal. EOMI. Head: Atraumatic. Nose: No congestion/rhinnorhea. Mouth/Throat: Mucous membranes are moist.   Neck: No stridor. Trachea Midline. FROM Cardiovascular: bradycardiac, regular rhythm. Grossly normal heart sounds.  Good peripheral circulation. No chest wall tenderness Respiratory: Normal respiratory effort.  No retractions. Lungs CTAB. Gastrointestinal: Soft and nontender. No distention. No abdominal bruits.  Musculoskeletal:   RUE: No point tenderness, deformity or other signs of injury. Radial pulse intact. Neuro intact. Full ROM in joint. LUE: No point tenderness, deformity or other signs of injury. Radial pulse intact. Neuro intact. Full ROM in joints RLE: No point tenderness, deformity or other signs of injury. DP pulse intact. Neuro intact. Full ROM in joints. LLE: No point tenderness, deformity or other signs of injury. DP pulse intact. Neuro intact. Full ROM in joints. Neurologic:  Normal speech and language. No gross focal neurologic deficits are appreciated.  Skin:  Skin is warm, dry and intact. No rash noted. Psychiatric: Mood and affect are  normal. Speech and behavior are normal. GU: Deferred   ____________________________________________   LABS (all labs ordered are listed, but only abnormal results are displayed)  Labs Reviewed  CBC WITH DIFFERENTIAL/PLATELET - Abnormal; Notable for the following components:      Result Value   Platelets 139 (*)    All other components within normal limits  COMPREHENSIVE METABOLIC PANEL - Abnormal; Notable for the following components:   Glucose, Bld 111 (*)    Creatinine, Ser 1.34 (*)    GFR calc non Af Amer 48 (*)    GFR calc Af Amer 55 (*)    All other components within normal limits  SARS CORONAVIRUS 2 (TAT 6-24 HRS)  URINALYSIS, ROUTINE W REFLEX MICROSCOPIC  TROPONIN I (HIGH SENSITIVITY)  TROPONIN I (HIGH SENSITIVITY)   ____________________________________________   ED ECG REPORT I, Vanessa No Name, the attending physician, personally viewed and interpreted this ECG.  EKG is normal sinus rate of 63, T wave inversions in V1, V2, V3,  no ST elevation, right bundle branch block. ____________________________________________  RADIOLOGY Robert Bellow, personally viewed and evaluated these images (plain radiographs) as part of my medical decision making, as well as reviewing the written report by the radiologist.  ED MD interpretation: Chest x-ray no pneumonia  Official radiology report(s): Ct Head Wo Contrast  Result Date: 03/25/2019 CLINICAL DATA:  Syncope while standing on a stool, unsure of head strike EXAM: CT HEAD WITHOUT CONTRAST CT CERVICAL SPINE WITHOUT CONTRAST TECHNIQUE: Multidetector CT imaging of the head and cervical spine was performed following the standard protocol without intravenous contrast. Multiplanar CT image reconstructions of the cervical spine were also generated. COMPARISON:  C-spine MRI 11/27/2006 FINDINGS: CT HEAD FINDINGS Brain: No evidence of acute infarction, hemorrhage, hydrocephalus, extra-axial collection or mass lesion/mass effect. Symmetric  prominence of the ventricles, cisterns and sulci compatible with some frontal predominant parenchymal volume loss. Patchy areas of white matter hypoattenuation are most compatible with chronic microvascular angiopathy. Vascular: Atherosclerotic calcification of the carotid siphons. No hyperdense vessel. Skull: No calvarial fracture or suspicious osseous lesion. No scalp swelling or hematoma. Sinuses/Orbits: Paranasal sinuses and mastoid air cells are predominantly clear. Orbital structures are unremarkable aside from prior lens extractions. Other: Mild TMJ arthrosis. CT CERVICAL SPINE FINDINGS Alignment: Preservation of the normal cervical lordosis without traumatic listhesis. No abnormal facet widening. Normal alignment of the craniocervical and atlantoaxial articulations. Skull base and vertebrae: No acute fracture. No primary bone lesion or focal pathologic process. Calcified pannus formation upon the dens. Soft tissues and spinal canal: No pre or paravertebral fluid or swelling. No visible canal hematoma. Disc levels: Multilevel intervertebral disc height loss with spondylitic endplate changes. Posterior ridging results in mild to moderate multilevel canal stenosis most pronounced at C5-6. Diffuse uncinate spurring and facet hypertrophic changes results in multilevel mild-to-moderate neural foraminal narrowing. Upper chest: No acute abnormality in the upper chest or imaged lung apices. Emphysematous changes noted in the lung apices. Other: Normal thyroid. Atherosclerosis of the cervical carotid arteries and proximal left subclavian artery. IMPRESSION: 1. No acute intracranial abnormality. 2. Mild frontal predominant parenchymal volume loss and chronic microvascular ischemic white matter disease. 3. No acute cervical spine fracture or traumatic listhesis. 4. Moderate to severe multilevel degenerative changes throughout the cervical spine. Electronically Signed   By: Lovena Le M.D.   On: 03/25/2019 17:06   Ct  Cervical Spine Wo Contrast  Result Date: 03/25/2019 CLINICAL DATA:  Syncope while standing on a stool, unsure of head strike EXAM: CT HEAD WITHOUT CONTRAST CT CERVICAL SPINE WITHOUT CONTRAST TECHNIQUE: Multidetector CT imaging of the head and cervical spine was performed following the standard protocol without intravenous contrast. Multiplanar CT image reconstructions of the cervical spine were also generated. COMPARISON:  C-spine MRI 11/27/2006 FINDINGS: CT HEAD FINDINGS Brain: No evidence of acute infarction, hemorrhage, hydrocephalus, extra-axial collection or mass lesion/mass effect. Symmetric prominence of the ventricles, cisterns and sulci compatible with some frontal predominant parenchymal volume loss. Patchy areas of white matter hypoattenuation are most compatible with chronic microvascular angiopathy. Vascular: Atherosclerotic calcification of the carotid siphons. No hyperdense vessel. Skull: No calvarial fracture or suspicious osseous lesion. No scalp swelling or hematoma. Sinuses/Orbits: Paranasal sinuses and mastoid air cells are predominantly clear. Orbital structures are unremarkable aside from prior lens extractions. Other: Mild TMJ arthrosis. CT CERVICAL SPINE FINDINGS Alignment: Preservation of the normal cervical lordosis without traumatic listhesis. No abnormal facet widening. Normal alignment of the craniocervical and atlantoaxial articulations. Skull base and vertebrae: No acute fracture. No primary bone  lesion or focal pathologic process. Calcified pannus formation upon the dens. Soft tissues and spinal canal: No pre or paravertebral fluid or swelling. No visible canal hematoma. Disc levels: Multilevel intervertebral disc height loss with spondylitic endplate changes. Posterior ridging results in mild to moderate multilevel canal stenosis most pronounced at C5-6. Diffuse uncinate spurring and facet hypertrophic changes results in multilevel mild-to-moderate neural foraminal narrowing.  Upper chest: No acute abnormality in the upper chest or imaged lung apices. Emphysematous changes noted in the lung apices. Other: Normal thyroid. Atherosclerosis of the cervical carotid arteries and proximal left subclavian artery. IMPRESSION: 1. No acute intracranial abnormality. 2. Mild frontal predominant parenchymal volume loss and chronic microvascular ischemic white matter disease. 3. No acute cervical spine fracture or traumatic listhesis. 4. Moderate to severe multilevel degenerative changes throughout the cervical spine. Electronically Signed   By: Lovena Le M.D.   On: 03/25/2019 17:06   Dg Chest Portable 1 View  Result Date: 03/25/2019 CLINICAL DATA:  Fall. EXAM: PORTABLE CHEST 1 VIEW COMPARISON:  Report from chest radiograph 05/14/2018 (images unavailable) FINDINGS: Heart size within normal limits.  Aortic atherosclerosis. No airspace consolidation. No evidence of pneumothorax or sizable pleural effusion. No displaced fracture is identified. Thoracic dextrocurvature. Surgical clips project over the mediastinum and right axilla. IMPRESSION: No airspace consolidation.  No evidence of pneumothorax. Thoracic dextrocurvature. Aortic atherosclerosis. Electronically Signed   By: Kellie Simmering DO   On: 03/25/2019 16:38    ____________________________________________   PROCEDURES  Procedure(s) performed (including Critical Care):  Procedures   ____________________________________________   INITIAL IMPRESSION / ASSESSMENT AND PLAN / ED COURSE  Patient story is most concerning for syncope given he does not remember the incident and significant cardiac history.  Will get cardiac markers to evaluate for ACS, arrhythmia.  No abdominal pain to suggest AAA.  No shortness of breath to suggest PE.  Will get basic labs evaluate for electrolyte abnormalities, AKI.  Will get CT head evaluate for intracranial hemorrhage and CT cervical to evaluate for cervical fracture.   Work-up was reassuring.   Creatinine was slightly elevated.  Initial troponin of 3.  CT imaging was negative.  Discussed with patient admission for syncope work-up versus discharge home after repeat troponins.  They felt more comfortable coming in for syncope work-up.  Admit to hospital.   ____________________________________________   FINAL CLINICAL IMPRESSION(S) / ED DIAGNOSES   Final diagnoses:  Syncope and collapse      MEDICATIONS GIVEN DURING THIS VISIT:  Medications - No data to display   ED Discharge Orders    None       Note:  This document was prepared using Dragon voice recognition software and may include unintentional dictation errors.   Vanessa [place], MD 03/25/19 [phone]

## 2019-03-25 NOTE — ED Notes (Signed)
Attempted to call report but RN not available. Will wait to hear back for 15 minutes before bedside report.

## 2019-03-25 NOTE — ED Notes (Signed)
Patient transported to CT 

## 2019-03-26 ENCOUNTER — Observation Stay (HOSPITAL_BASED_OUTPATIENT_CLINIC_OR_DEPARTMENT_OTHER)
Admit: 2019-03-26 | Discharge: 2019-03-26 | Disposition: A | Discharge status: Home / Self Care | Payer: Medicare HMO | Attending: Family Medicine | Admitting: Family Medicine

## 2019-03-26 DIAGNOSIS — I2510 Atherosclerotic heart disease of native coronary artery without angina pectoris: Secondary | ICD-10-CM | POA: Diagnosis not present

## 2019-03-26 DIAGNOSIS — K219 Gastro-esophageal reflux disease without esophagitis: Secondary | ICD-10-CM | POA: Diagnosis not present

## 2019-03-26 DIAGNOSIS — I361 Nonrheumatic tricuspid (valve) insufficiency: Secondary | ICD-10-CM | POA: Diagnosis not present

## 2019-03-26 DIAGNOSIS — N179 Acute kidney failure, unspecified: Secondary | ICD-10-CM | POA: Diagnosis not present

## 2019-03-26 DIAGNOSIS — R55 Syncope and collapse: Secondary | ICD-10-CM | POA: Diagnosis not present

## 2019-03-26 DIAGNOSIS — E785 Hyperlipidemia, unspecified: Secondary | ICD-10-CM | POA: Diagnosis not present

## 2019-03-26 LAB — CBC
HCT: 36.2 % — ABNORMAL LOW (ref 39.0–52.0)
Hemoglobin: 12.1 g/dL — ABNORMAL LOW (ref 13.0–17.0)
MCH: 28.9 pg (ref 26.0–34.0)
MCHC: 33.4 g/dL (ref 30.0–36.0)
MCV: 86.6 fL (ref 80.0–100.0)
Platelets: 125 10*3/uL — ABNORMAL LOW (ref 150–400)
RBC: 4.18 MIL/uL — ABNORMAL LOW (ref 4.22–5.81)
RDW: 12.2 % (ref 11.5–15.5)
WBC: 8.2 10*3/uL (ref 4.0–10.5)
nRBC: 0 % (ref 0.0–0.2)

## 2019-03-26 LAB — ECHOCARDIOGRAM COMPLETE
Height: 68 in
Weight: 3360 oz

## 2019-03-26 LAB — BASIC METABOLIC PANEL
Anion gap: 7 (ref 5–15)
BUN: 14 mg/dL (ref 8–23)
CO2: 27 mmol/L (ref 22–32)
Calcium: 8.7 mg/dL — ABNORMAL LOW (ref 8.9–10.3)
Chloride: 105 mmol/L (ref 98–111)
Creatinine, Ser: 1.08 mg/dL (ref 0.61–1.24)
GFR calc Af Amer: >60 mL/min (ref >60)
GFR calc non Af Amer: >60 mL/min (ref >60)
Glucose, Bld: 101 mg/dL — ABNORMAL HIGH (ref 70–99)
Potassium: 4 mmol/L (ref 3.5–5.1)
Sodium: 139 mmol/L (ref 135–145)

## 2019-03-26 LAB — SARS CORONAVIRUS 2 (TAT 6-24 HRS): SARS Coronavirus 2: NEGATIVE

## 2019-03-26 MED ORDER — TIZANIDINE HCL 4 MG PO TABS: 4.0000 mg | ORAL_TABLET | Freq: Every day | ORAL | 0 refills | Status: AC

## 2019-03-26 MED ORDER — NABUMETONE 500 MG PO TABS: 500.0000 mg | ORAL_TABLET | Freq: Every day | ORAL | 0 refills | Status: AC | PRN

## 2019-03-26 MED ORDER — ACETAMINOPHEN 325 MG PO TABS
650.0000 mg | ORAL_TABLET | Freq: Four times a day (QID) | ORAL | Status: DC | PRN
  Administered 2019-03-26: 650 mg via ORAL

## 2019-03-26 NOTE — Discharge Summary (Signed)
[facility] [place] NAME: Robert Reynolds    MR#:  [id number]  DATE OF BIRTH:  1930-09-14  DATE OF ADMISSION:  03/25/2019 ADMITTING PHYSICIAN: Orene Desanctis, DO  DATE OF DISCHARGE: 03/26/2019  PRIMARY CARE PHYSICIAN: Idelle Crouch, MD    ADMISSION DIAGNOSIS:  Syncope and collapse [R55]  DISCHARGE DIAGNOSIS:  Syncope suspected due to acute renal failure  SECONDARY DIAGNOSIS:   Past Medical History:  Diagnosis Date  . Arthritis   . BPH (benign prostatic hyperplasia)   . Coronary artery disease   . GERD (gastroesophageal reflux disease)   . Hypertension   . Hypothyroidism   . Neuropathy   . Shortness of breath dyspnea    DOE    HOSPITAL COURSE:  Robert Reynolds is a 85 y.o. male with medical history significant of CAD s/p CABG, hypertension, OSA, hyperlipidemia who presents for a syncopal episode.  Patient today was cleaning at home with his daughter.  He went to another room and remembers taking 1 step up a stepstool and the next thing he could remember was being surrounded by EMS.  He denies any lightheadedness or dizziness prior.  No chest pain, shortness of breath or palpitation.    *Syncope -Patient had loss of consciousness without any prodrome.  Although likely could be due to hypovolemia given AKI  Vs vasovagal while cleaning the windows -NSR on telemetry -orthostatic vital signs negative - echocardiogram done results pending - Last echo in 02/2017 reportedly shows normal LV function, no AS,mild MR -patient denies any chest pain or shortness of breath. He feels back to baseline. -CT head negative -CT cervical spine shows multiple level degenerative changes  *Acute renal failure appears prerenal azotemia now resolved with IV fluids  -Admission creatinine of 1.34 from a prior of 1.2 in 04/2018--1.08 -received  IV NS fluids -appoint appears you will make. Patient advised to hydrate at home  *CAD s/p CABG - continue  aspirin -troponin's negative. No chest pain  *Hypertension - continue metoprolol   *Hyperlipidemia - continue simvastatin   *GERD -continue PPI and reglan  *Chronic arthritic pain -Pt advised to take nabumetone prn onlygiven AKI - continue gabapentin - pt reports taking Tizandine qhs only  Patient overall feels at baseline. He will ambulate around the nurses station. He feels better will discharge patient to home. He is in agreement with the plan and requesting to go home. Daughter at bedside agrees with the plan.  *DVT prophylaxis:.Lovenox *Code Status: Full *Family Communication: Plan discussed with patient and daughter at bedside.  All questions  addressed. disposition Plan: Home with family  CONSULTS OBTAINED:    DRUG ALLERGIES:  No Known Allergies  DISCHARGE MEDICATIONS:   Allergies as of 03/26/2019   No Known Allergies     Medication List    TAKE these medications   acetaminophen 325 MG tablet Commonly known as: TYLENOL Take 325-650 mg by mouth every 4 (four) hours as needed for mild pain or moderate pain.   aspirin 81 MG tablet Take 81 mg by mouth daily.   Calcium Carbonate-Simethicone 1000-60 MG Chew Chew 1 tablet by mouth daily.   gabapentin 100 MG capsule Commonly known as: NEURONTIN Take 100 mg by mouth 3 (three) times daily.   magnesium oxide 400 MG tablet Commonly known as: MAG-OX Take 600 mg by mouth daily.   metoCLOPramide 10 MG tablet Commonly known as: REGLAN Take 10 mg by mouth daily.   metoprolol tartrate 25 MG tablet  Commonly known as: LOPRESSOR Take 25 mg by mouth 2 (two) times daily.   multivitamin-lutein Caps capsule Take 1 capsule by mouth daily.   nabumetone 500 MG tablet Commonly known as: RELAFEN Take 1 tablet (500 mg total) by mouth daily as needed. What changed:   when to take this  reasons to take this   pantoprazole 40 MG tablet Commonly known as: PROTONIX Take 40 mg by mouth 2 (two) times daily.    simvastatin 40 MG tablet Commonly known as: ZOCOR Take 40 mg by mouth at bedtime.   tamsulosin 0.4 MG Caps capsule Commonly known as: FLOMAX Take 0.4 mg by mouth daily.   tiZANidine 4 MG tablet Commonly known as: ZANAFLEX Take 1 tablet (4 mg total) by mouth at bedtime. What changed: when to take this   vitamin B-12 1000 MCG tablet Commonly known as: CYANOCOBALAMIN Take 1,000 mcg by mouth daily.       If you experience worsening of your admission symptoms, develop shortness of breath, life threatening emergency, suicidal or homicidal thoughts you must seek medical attention immediately by calling 911 or calling your MD immediately  if symptoms less severe.  You Must read complete instructions/literature along with all the possible adverse reactions/side effects for all the Medicines you take and that have been prescribed to you. Take any new Medicines after you have completely understood and accept all the possible adverse reactions/side effects.   Please note  You were cared for by a hospitalist during your hospital stay. If you have any questions about your discharge medications or the care you received while you were in the hospital after you are discharged, you can call the unit and asked to speak with the hospitalist on call if the hospitalist that took care of you is not available. Once you are discharged, your primary care physician will handle any further medical issues. Please note that NO REFILLS for any discharge medications will be authorized once you are discharged, as it is imperative that you return to your primary care physician (or establish a relationship with a primary care physician if you do not have one) for your aftercare needs so that they can reassess your need for medications and monitor your lab values. Today   SUBJECTIVE   I feel back to baseline. Can I go home?  VITAL SIGNS:  Blood pressure 134/74, pulse 65, temperature 98.7 F (37.1 C), temperature  source Oral, resp. rate 19, height 5\' 8"  (1.727 m), weight 95.3 kg, SpO2 96 %.  I/O:    Intake/Output Summary (Last 24 hours) at 03/26/2019 1243 Last data filed at 03/26/2019 0630 Gross per 24 hour  Intake 259.73 ml  Output 900 ml  Net -640.27 ml    PHYSICAL EXAMINATION:  GENERAL:  85 y.o.-year-old patient lying in the bed with no acute distress.  EYES: Pupils equal, round, reactive to light and accommodation. No scleral icterus. Extraocular muscles intact.  HEENT: Head atraumatic, normocephalic. Oropharynx and nasopharynx clear.  NECK:  Supple, no jugular venous distention. No thyroid enlargement, no tenderness.  LUNGS: Normal breath sounds bilaterally, no wheezing, rales,rhonchi or crepitation. No use of accessory muscles of respiration.  CARDIOVASCULAR: S1, S2 normal. No murmurs, rubs, or gallops.  ABDOMEN: Soft, non-tender, non-distended. Bowel sounds present. No organomegaly or mass.  EXTREMITIES: No pedal edema, cyanosis, or clubbing.  NEUROLOGIC: Cranial nerves II through XII are intact. Muscle strength 5/5 in all extremities. Sensation intact. Gait not checked.  PSYCHIATRIC: The patient is alert and oriented x 3.  SKIN: No obvious rash, lesion, or ulcer.   DATA REVIEW:   CBC  Recent Labs  Lab 03/26/19 0417  WBC 8.2  HGB 12.1*  HCT 36.2*  PLT 125*    Chemistries  Recent Labs  Lab 03/25/19 1619 03/26/19 0417  NA 138 139  K 3.9 4.0  CL 101 105  CO2 26 27  GLUCOSE 111* 101*  BUN 16 14  CREATININE 1.34* 1.08  CALCIUM 9.3 8.7*  AST 26  --   ALT 25  --   ALKPHOS 77  --   BILITOT 1.0  --     Microbiology Results   Recent Results (from the past 240 hour(s))  SARS CORONAVIRUS 2 (TAT 6-24 HRS) Nasopharyngeal Nasopharyngeal Swab     Status: None   Collection Time: 03/25/19  6:34 PM   Specimen: Nasopharyngeal Swab  Result Value Ref Range Status   SARS Coronavirus 2 NEGATIVE NEGATIVE Final    Comment: (NOTE) SARS-CoV-2 target nucleic acids are NOT  DETECTED. The SARS-CoV-2 RNA is generally detectable in upper and lower respiratory specimens during the acute phase of infection. Negative results do not preclude SARS-CoV-2 infection, do not rule out co-infections with other pathogens, and should not be used as the sole basis for treatment or other patient management decisions. Negative results must be combined with clinical observations, patient history, and epidemiological information. The expected result is Negative. Fact Sheet for Patients: SugarRoll.be Fact Sheet for Healthcare Providers: https://www.woods-mathews.com/ This test is not yet approved or cleared by the [place] FDA and  has been authorized for detection and/or diagnosis of SARS-CoV-2 by FDA under an Emergency Use Authorization (EUA). This EUA will remain  in effect (meaning this test can be used) for the duration of the COVID-19 declaration under Section 56 4(b)(1) of the Act, 21 U.S.C. section 360bbb-3(b)(1), unless the authorization is terminated or revoked sooner. Performed at [facility], [place] [address], [place], [place] [address]     RADIOLOGY:  Ct Head Wo Contrast  Result Date: 03/25/2019 CLINICAL DATA:  Syncope while standing on a stool, unsure of head strike EXAM: CT HEAD WITHOUT CONTRAST CT CERVICAL SPINE WITHOUT CONTRAST TECHNIQUE: Multidetector CT imaging of the head and cervical spine was performed following the standard protocol without intravenous contrast. Multiplanar CT image reconstructions of the cervical spine were also generated. COMPARISON:  C-spine MRI 11/27/2006 FINDINGS: CT HEAD FINDINGS Brain: No evidence of acute infarction, hemorrhage, hydrocephalus, extra-axial collection or mass lesion/mass effect. Symmetric prominence of the ventricles, cisterns and sulci compatible with some frontal predominant parenchymal volume loss. Patchy areas of white matter hypoattenuation are most compatible  with chronic microvascular angiopathy. Vascular: Atherosclerotic calcification of the carotid siphons. No hyperdense vessel. Skull: No calvarial fracture or suspicious osseous lesion. No scalp swelling or hematoma. Sinuses/Orbits: Paranasal sinuses and mastoid air cells are predominantly clear. Orbital structures are unremarkable aside from prior lens extractions. Other: Mild TMJ arthrosis. CT CERVICAL SPINE FINDINGS Alignment: Preservation of the normal cervical lordosis without traumatic listhesis. No abnormal facet widening. Normal alignment of the craniocervical and atlantoaxial articulations. Skull base and vertebrae: No acute fracture. No primary bone lesion or focal pathologic process. Calcified pannus formation upon the dens. Soft tissues and spinal canal: No pre or paravertebral fluid or swelling. No visible canal hematoma. Disc levels: Multilevel intervertebral disc height loss with spondylitic endplate changes. Posterior ridging results in mild to moderate multilevel canal stenosis most pronounced at C5-6. Diffuse uncinate spurring and facet hypertrophic changes results in multilevel mild-to-moderate neural  foraminal narrowing. Upper chest: No acute abnormality in the upper chest or imaged lung apices. Emphysematous changes noted in the lung apices. Other: Normal thyroid. Atherosclerosis of the cervical carotid arteries and proximal left subclavian artery. IMPRESSION: 1. No acute intracranial abnormality. 2. Mild frontal predominant parenchymal volume loss and chronic microvascular ischemic white matter disease. 3. No acute cervical spine fracture or traumatic listhesis. 4. Moderate to severe multilevel degenerative changes throughout the cervical spine. Electronically Signed   By: Lovena Le M.D.   On: 03/25/2019 17:06   Ct Cervical Spine Wo Contrast  Result Date: 03/25/2019 CLINICAL DATA:  Syncope while standing on a stool, unsure of head strike EXAM: CT HEAD WITHOUT CONTRAST CT CERVICAL SPINE  WITHOUT CONTRAST TECHNIQUE: Multidetector CT imaging of the head and cervical spine was performed following the standard protocol without intravenous contrast. Multiplanar CT image reconstructions of the cervical spine were also generated. COMPARISON:  C-spine MRI 11/27/2006 FINDINGS: CT HEAD FINDINGS Brain: No evidence of acute infarction, hemorrhage, hydrocephalus, extra-axial collection or mass lesion/mass effect. Symmetric prominence of the ventricles, cisterns and sulci compatible with some frontal predominant parenchymal volume loss. Patchy areas of white matter hypoattenuation are most compatible with chronic microvascular angiopathy. Vascular: Atherosclerotic calcification of the carotid siphons. No hyperdense vessel. Skull: No calvarial fracture or suspicious osseous lesion. No scalp swelling or hematoma. Sinuses/Orbits: Paranasal sinuses and mastoid air cells are predominantly clear. Orbital structures are unremarkable aside from prior lens extractions. Other: Mild TMJ arthrosis. CT CERVICAL SPINE FINDINGS Alignment: Preservation of the normal cervical lordosis without traumatic listhesis. No abnormal facet widening. Normal alignment of the craniocervical and atlantoaxial articulations. Skull base and vertebrae: No acute fracture. No primary bone lesion or focal pathologic process. Calcified pannus formation upon the dens. Soft tissues and spinal canal: No pre or paravertebral fluid or swelling. No visible canal hematoma. Disc levels: Multilevel intervertebral disc height loss with spondylitic endplate changes. Posterior ridging results in mild to moderate multilevel canal stenosis most pronounced at C5-6. Diffuse uncinate spurring and facet hypertrophic changes results in multilevel mild-to-moderate neural foraminal narrowing. Upper chest: No acute abnormality in the upper chest or imaged lung apices. Emphysematous changes noted in the lung apices. Other: Normal thyroid. Atherosclerosis of the cervical  carotid arteries and proximal left subclavian artery. IMPRESSION: 1. No acute intracranial abnormality. 2. Mild frontal predominant parenchymal volume loss and chronic microvascular ischemic white matter disease. 3. No acute cervical spine fracture or traumatic listhesis. 4. Moderate to severe multilevel degenerative changes throughout the cervical spine. Electronically Signed   By: Lovena Le M.D.   On: 03/25/2019 17:06   Dg Chest Portable 1 View  Result Date: 03/25/2019 CLINICAL DATA:  Fall. EXAM: PORTABLE CHEST 1 VIEW COMPARISON:  Report from chest radiograph 05/14/2018 (images unavailable) FINDINGS: Heart size within normal limits.  Aortic atherosclerosis. No airspace consolidation. No evidence of pneumothorax or sizable pleural effusion. No displaced fracture is identified. Thoracic dextrocurvature. Surgical clips project over the mediastinum and right axilla. IMPRESSION: No airspace consolidation.  No evidence of pneumothorax. Thoracic dextrocurvature. Aortic atherosclerosis. Electronically Signed   By: Kellie Simmering DO   On: 03/25/2019 16:38     CODE STATUS:     Code Status Orders  (From admission, onward)         Start     Ordered   03/25/19 1836  Full code  Continuous     03/25/19 1835        Code Status History    This patient has a current  code status but no historical code status.   Advance Care Planning Activity    Advance Directive Documentation     Most Recent Value  Type of Advance Directive  Healthcare Power of Attorney, Living will  Pre-existing out of facility DNR order (yellow form or pink MOST form)  -  "MOST" Form in Place?  -     Family Discussion: Consults:   TOTAL TIME TAKING CARE OF THIS PATIENT: **40* minutes.    Fritzi Mandes M.D on 03/26/2019 at 12:43 PM  Between 7am to 6pm - Pager - [phone] After 6pm go to [email or web address] - password [email or web address]  [facility]    CC: Primary care physician; Idelle Crouch, MD

## 2019-04-07 DIAGNOSIS — Z961 Presence of intraocular lens: Secondary | ICD-10-CM | POA: Diagnosis not present

## 2019-04-07 DIAGNOSIS — H353211 Exudative age-related macular degeneration, right eye, with active choroidal neovascularization: Secondary | ICD-10-CM | POA: Diagnosis not present

## 2019-04-12 DIAGNOSIS — I25810 Atherosclerosis of coronary artery bypass graft(s) without angina pectoris: Secondary | ICD-10-CM | POA: Diagnosis not present

## 2019-04-12 DIAGNOSIS — M898X5 Other specified disorders of bone, thigh: Secondary | ICD-10-CM | POA: Diagnosis not present

## 2019-04-12 DIAGNOSIS — W010XXA Fall on same level from slipping, tripping and stumbling without subsequent striking against object, initial encounter: Secondary | ICD-10-CM | POA: Diagnosis not present

## 2019-04-12 DIAGNOSIS — R55 Syncope and collapse: Secondary | ICD-10-CM | POA: Diagnosis not present

## 2019-04-12 DIAGNOSIS — R102 Pelvic and perineal pain: Secondary | ICD-10-CM | POA: Diagnosis not present

## 2019-04-12 DIAGNOSIS — M25552 Pain in left hip: Secondary | ICD-10-CM | POA: Diagnosis not present

## 2019-04-12 DIAGNOSIS — M1612 Unilateral primary osteoarthritis, left hip: Secondary | ICD-10-CM | POA: Diagnosis not present

## 2019-04-12 DIAGNOSIS — Z87891 Personal history of nicotine dependence: Secondary | ICD-10-CM | POA: Diagnosis not present

## 2019-04-20 DIAGNOSIS — G4733 Obstructive sleep apnea (adult) (pediatric): Secondary | ICD-10-CM | POA: Diagnosis not present

## 2019-04-20 DIAGNOSIS — I10 Essential (primary) hypertension: Secondary | ICD-10-CM | POA: Diagnosis not present

## 2019-04-20 DIAGNOSIS — K219 Gastro-esophageal reflux disease without esophagitis: Secondary | ICD-10-CM | POA: Diagnosis not present

## 2019-04-20 DIAGNOSIS — K3184 Gastroparesis: Secondary | ICD-10-CM | POA: Diagnosis not present

## 2019-04-20 DIAGNOSIS — Z9989 Dependence on other enabling machines and devices: Secondary | ICD-10-CM | POA: Diagnosis not present

## 2019-04-20 DIAGNOSIS — I25810 Atherosclerosis of coronary artery bypass graft(s) without angina pectoris: Secondary | ICD-10-CM | POA: Diagnosis not present

## 2019-04-26 DIAGNOSIS — R55 Syncope and collapse: Secondary | ICD-10-CM | POA: Diagnosis not present

## 2019-04-26 DIAGNOSIS — I6522 Occlusion and stenosis of left carotid artery: Secondary | ICD-10-CM | POA: Diagnosis not present

## 2019-06-10 DIAGNOSIS — H353132 Nonexudative age-related macular degeneration, bilateral, intermediate dry stage: Secondary | ICD-10-CM | POA: Diagnosis not present

## 2019-06-10 DIAGNOSIS — H353211 Exudative age-related macular degeneration, right eye, with active choroidal neovascularization: Secondary | ICD-10-CM | POA: Diagnosis not present

## 2019-06-10 DIAGNOSIS — H353122 Nonexudative age-related macular degeneration, left eye, intermediate dry stage: Secondary | ICD-10-CM | POA: Diagnosis not present

## 2019-06-11 DIAGNOSIS — I739 Peripheral vascular disease, unspecified: Secondary | ICD-10-CM | POA: Diagnosis not present

## 2019-06-11 DIAGNOSIS — Z9989 Dependence on other enabling machines and devices: Secondary | ICD-10-CM | POA: Diagnosis not present

## 2019-06-11 DIAGNOSIS — R55 Syncope and collapse: Secondary | ICD-10-CM | POA: Diagnosis not present

## 2019-06-11 DIAGNOSIS — E785 Hyperlipidemia, unspecified: Secondary | ICD-10-CM | POA: Diagnosis not present

## 2019-06-11 DIAGNOSIS — I10 Essential (primary) hypertension: Secondary | ICD-10-CM | POA: Diagnosis not present

## 2019-06-11 DIAGNOSIS — G4733 Obstructive sleep apnea (adult) (pediatric): Secondary | ICD-10-CM | POA: Diagnosis not present

## 2019-06-11 DIAGNOSIS — I25810 Atherosclerosis of coronary artery bypass graft(s) without angina pectoris: Secondary | ICD-10-CM | POA: Diagnosis not present

## 2019-06-28 DIAGNOSIS — I739 Peripheral vascular disease, unspecified: Secondary | ICD-10-CM | POA: Diagnosis not present

## 2019-06-29 ENCOUNTER — Other Ambulatory Visit: Payer: Self-pay | Admitting: Internal Medicine

## 2019-06-29 DIAGNOSIS — R270 Ataxia, unspecified: Secondary | ICD-10-CM

## 2019-06-29 DIAGNOSIS — R4701 Aphasia: Secondary | ICD-10-CM | POA: Diagnosis not present

## 2019-06-29 DIAGNOSIS — R21 Rash and other nonspecific skin eruption: Secondary | ICD-10-CM | POA: Diagnosis not present

## 2019-06-29 DIAGNOSIS — G629 Polyneuropathy, unspecified: Secondary | ICD-10-CM | POA: Diagnosis not present

## 2019-06-29 DIAGNOSIS — R202 Paresthesia of skin: Secondary | ICD-10-CM | POA: Diagnosis not present

## 2019-06-29 DIAGNOSIS — M5136 Other intervertebral disc degeneration, lumbar region: Secondary | ICD-10-CM | POA: Diagnosis not present

## 2019-06-29 DIAGNOSIS — Z87891 Personal history of nicotine dependence: Secondary | ICD-10-CM | POA: Diagnosis not present

## 2019-07-10 ENCOUNTER — Other Ambulatory Visit: Payer: Self-pay

## 2019-07-10 ENCOUNTER — Ambulatory Visit
Admission: RE | Admit: 2019-07-10 | Discharge: 2019-07-10 | Disposition: A | Discharge status: Home / Self Care | Payer: Medicare HMO | Source: Ambulatory Visit | Attending: Internal Medicine | Admitting: Internal Medicine

## 2019-07-10 DIAGNOSIS — R270 Ataxia, unspecified: Secondary | ICD-10-CM | POA: Diagnosis not present

## 2019-07-10 DIAGNOSIS — R4701 Aphasia: Secondary | ICD-10-CM | POA: Diagnosis not present

## 2019-07-10 DIAGNOSIS — R200 Anesthesia of skin: Secondary | ICD-10-CM | POA: Diagnosis not present

## 2019-07-30 DIAGNOSIS — R270 Ataxia, unspecified: Secondary | ICD-10-CM | POA: Diagnosis not present

## 2019-07-30 DIAGNOSIS — R4701 Aphasia: Secondary | ICD-10-CM | POA: Diagnosis not present

## 2019-07-30 DIAGNOSIS — M5136 Other intervertebral disc degeneration, lumbar region: Secondary | ICD-10-CM | POA: Diagnosis not present

## 2019-07-30 DIAGNOSIS — Z87891 Personal history of nicotine dependence: Secondary | ICD-10-CM | POA: Diagnosis not present

## 2019-07-30 DIAGNOSIS — R29898 Other symptoms and signs involving the musculoskeletal system: Secondary | ICD-10-CM | POA: Diagnosis not present

## 2019-08-04 DIAGNOSIS — H353211 Exudative age-related macular degeneration, right eye, with active choroidal neovascularization: Secondary | ICD-10-CM | POA: Diagnosis not present

## 2019-08-26 DIAGNOSIS — G609 Hereditary and idiopathic neuropathy, unspecified: Secondary | ICD-10-CM | POA: Diagnosis not present

## 2019-08-26 DIAGNOSIS — R4189 Other symptoms and signs involving cognitive functions and awareness: Secondary | ICD-10-CM | POA: Diagnosis not present

## 2019-08-26 DIAGNOSIS — G44229 Chronic tension-type headache, not intractable: Secondary | ICD-10-CM | POA: Diagnosis not present

## 2019-08-26 DIAGNOSIS — E538 Deficiency of other specified B group vitamins: Secondary | ICD-10-CM | POA: Diagnosis not present

## 2019-08-26 DIAGNOSIS — R2689 Other abnormalities of gait and mobility: Secondary | ICD-10-CM | POA: Diagnosis not present

## 2019-09-07 DIAGNOSIS — M6281 Muscle weakness (generalized): Secondary | ICD-10-CM | POA: Diagnosis not present

## 2019-09-08 ENCOUNTER — Other Ambulatory Visit: Payer: Self-pay

## 2019-09-08 ENCOUNTER — Ambulatory Visit: Payer: Medicare HMO | Admitting: Dermatology

## 2019-09-08 DIAGNOSIS — L820 Inflamed seborrheic keratosis: Secondary | ICD-10-CM | POA: Diagnosis not present

## 2019-09-08 DIAGNOSIS — D692 Other nonthrombocytopenic purpura: Secondary | ICD-10-CM | POA: Diagnosis not present

## 2019-09-08 DIAGNOSIS — L578 Other skin changes due to chronic exposure to nonionizing radiation: Secondary | ICD-10-CM | POA: Diagnosis not present

## 2019-09-08 DIAGNOSIS — L821 Other seborrheic keratosis: Secondary | ICD-10-CM

## 2019-09-08 NOTE — Patient Instructions (Signed)

## 2019-09-08 NOTE — Progress Notes (Signed)
   Follow-Up Visit   Subjective  Robert Reynolds is a 86 y.o. male who presents for the following: Other (Scaly spots on scalp that get irritated).  The following portions of the chart were reviewed this encounter and updated as appropriate:  Tobacco  Allergies  Meds  Problems  Med Hx  Surg Hx  Fam Hx      Review of Systems:  No other skin or systemic complaints except as noted in HPI or Assessment and Plan.  Objective  Well appearing patient in no apparent distress; mood and affect are within normal limits.  A focused examination was performed including face, neck, chest and back and scalp, face. Relevant physical exam findings are noted in the Assessment and Plan.  Objective  Scalp (2): Erythematous keratotic or waxy stuck-on papule or plaque.    Assessment & Plan    Actinic Damage - diffuse scaly erythematous macules with underlying dyspigmentation - Recommend daily broad spectrum sunscreen SPF 30+ to sun-exposed areas, reapply every 2 hours as needed.  - Call for new or changing lesions.  Seborrheic Keratoses - Stuck-on, waxy, tan-brown papules and plaques  - Discussed benign etiology and prognosis. - Observe - Call for any changes  Purpura - Violaceous macules and patches - Benign - Related to age, sun damage and/or use of blood thinners - Observe - Can use OTC arnica containing moisturizer such as Dermend Bruise Formula if desired - Call for worsening or other concerns    Inflamed seborrheic keratosis (2) Scalp  RTC if not resolved after 6 weeks.  Destruction of lesion - Scalp Complexity: simple   Destruction method: cryotherapy   Informed consent: discussed and consent obtained   Timeout:  patient name, date of birth, surgical site, and procedure verified Lesion destroyed using liquid nitrogen: Yes   Region frozen until ice ball extended beyond lesion: Yes   Outcome: patient tolerated procedure well with no complications   Post-procedure  details: wound care instructions given    Return in about 1 year (around 09/07/2020).   I, Ashok Cordia, CMA, am acting as scribe for Sarina Ser, MD .  Documentation: I have reviewed the above documentation for accuracy and completeness, and I agree with the above.  Sarina Ser, MD

## 2019-09-10 DIAGNOSIS — M6281 Muscle weakness (generalized): Secondary | ICD-10-CM | POA: Diagnosis not present

## 2019-09-11 ENCOUNTER — Encounter: Payer: Self-pay | Admitting: Dermatology

## 2019-09-14 DIAGNOSIS — M6281 Muscle weakness (generalized): Secondary | ICD-10-CM | POA: Diagnosis not present

## 2019-09-16 DIAGNOSIS — M6281 Muscle weakness (generalized): Secondary | ICD-10-CM | POA: Diagnosis not present

## 2019-09-21 DIAGNOSIS — M6281 Muscle weakness (generalized): Secondary | ICD-10-CM | POA: Diagnosis not present

## 2019-09-23 DIAGNOSIS — M6281 Muscle weakness (generalized): Secondary | ICD-10-CM | POA: Diagnosis not present

## 2019-09-30 DIAGNOSIS — H353211 Exudative age-related macular degeneration, right eye, with active choroidal neovascularization: Secondary | ICD-10-CM | POA: Diagnosis not present

## 2019-10-20 ENCOUNTER — Ambulatory Visit: Payer: Medicare HMO | Admitting: Dermatology

## 2019-10-22 DIAGNOSIS — M25511 Pain in right shoulder: Secondary | ICD-10-CM | POA: Diagnosis not present

## 2019-10-22 DIAGNOSIS — M25561 Pain in right knee: Secondary | ICD-10-CM | POA: Diagnosis not present

## 2019-10-22 DIAGNOSIS — M25521 Pain in right elbow: Secondary | ICD-10-CM | POA: Diagnosis not present

## 2019-10-22 DIAGNOSIS — S52124A Nondisplaced fracture of head of right radius, initial encounter for closed fracture: Secondary | ICD-10-CM | POA: Diagnosis not present

## 2019-10-22 DIAGNOSIS — M25551 Pain in right hip: Secondary | ICD-10-CM | POA: Diagnosis not present

## 2019-11-01 DIAGNOSIS — G4733 Obstructive sleep apnea (adult) (pediatric): Secondary | ICD-10-CM | POA: Diagnosis not present

## 2019-11-01 DIAGNOSIS — Z79899 Other long term (current) drug therapy: Secondary | ICD-10-CM | POA: Diagnosis not present

## 2019-11-01 DIAGNOSIS — Z1211 Encounter for screening for malignant neoplasm of colon: Secondary | ICD-10-CM | POA: Diagnosis not present

## 2019-11-01 DIAGNOSIS — E785 Hyperlipidemia, unspecified: Secondary | ICD-10-CM | POA: Diagnosis not present

## 2019-11-01 DIAGNOSIS — I2510 Atherosclerotic heart disease of native coronary artery without angina pectoris: Secondary | ICD-10-CM | POA: Diagnosis not present

## 2019-11-01 DIAGNOSIS — Z125 Encounter for screening for malignant neoplasm of prostate: Secondary | ICD-10-CM | POA: Diagnosis not present

## 2019-11-01 DIAGNOSIS — I10 Essential (primary) hypertension: Secondary | ICD-10-CM | POA: Diagnosis not present

## 2019-11-01 DIAGNOSIS — Z0000 Encounter for general adult medical examination without abnormal findings: Secondary | ICD-10-CM | POA: Diagnosis not present

## 2019-11-03 DIAGNOSIS — S52124D Nondisplaced fracture of head of right radius, subsequent encounter for closed fracture with routine healing: Secondary | ICD-10-CM | POA: Diagnosis not present

## 2019-11-05 DIAGNOSIS — Z79899 Other long term (current) drug therapy: Secondary | ICD-10-CM | POA: Diagnosis not present

## 2019-11-05 DIAGNOSIS — E785 Hyperlipidemia, unspecified: Secondary | ICD-10-CM | POA: Diagnosis not present

## 2019-11-05 DIAGNOSIS — I2510 Atherosclerotic heart disease of native coronary artery without angina pectoris: Secondary | ICD-10-CM | POA: Diagnosis not present

## 2019-11-05 DIAGNOSIS — Z125 Encounter for screening for malignant neoplasm of prostate: Secondary | ICD-10-CM | POA: Diagnosis not present

## 2019-11-05 DIAGNOSIS — Z1211 Encounter for screening for malignant neoplasm of colon: Secondary | ICD-10-CM | POA: Diagnosis not present

## 2019-11-05 DIAGNOSIS — I10 Essential (primary) hypertension: Secondary | ICD-10-CM | POA: Diagnosis not present

## 2019-11-05 DIAGNOSIS — G4733 Obstructive sleep apnea (adult) (pediatric): Secondary | ICD-10-CM | POA: Diagnosis not present

## 2019-11-05 DIAGNOSIS — Z0000 Encounter for general adult medical examination without abnormal findings: Secondary | ICD-10-CM | POA: Diagnosis not present

## 2019-11-08 DIAGNOSIS — M7062 Trochanteric bursitis, left hip: Secondary | ICD-10-CM | POA: Diagnosis not present

## 2019-11-18 DIAGNOSIS — R4189 Other symptoms and signs involving cognitive functions and awareness: Secondary | ICD-10-CM | POA: Diagnosis not present

## 2019-11-18 DIAGNOSIS — G44229 Chronic tension-type headache, not intractable: Secondary | ICD-10-CM | POA: Diagnosis not present

## 2019-11-18 DIAGNOSIS — R2689 Other abnormalities of gait and mobility: Secondary | ICD-10-CM | POA: Diagnosis not present

## 2019-11-18 DIAGNOSIS — G609 Hereditary and idiopathic neuropathy, unspecified: Secondary | ICD-10-CM | POA: Diagnosis not present

## 2019-11-24 DIAGNOSIS — S52124D Nondisplaced fracture of head of right radius, subsequent encounter for closed fracture with routine healing: Secondary | ICD-10-CM | POA: Diagnosis not present

## 2019-11-25 DIAGNOSIS — H353211 Exudative age-related macular degeneration, right eye, with active choroidal neovascularization: Secondary | ICD-10-CM | POA: Diagnosis not present

## 2019-12-14 DIAGNOSIS — I10 Essential (primary) hypertension: Secondary | ICD-10-CM | POA: Diagnosis not present

## 2019-12-14 DIAGNOSIS — I25810 Atherosclerosis of coronary artery bypass graft(s) without angina pectoris: Secondary | ICD-10-CM | POA: Diagnosis not present

## 2019-12-14 DIAGNOSIS — N179 Acute kidney failure, unspecified: Secondary | ICD-10-CM | POA: Diagnosis not present

## 2019-12-14 DIAGNOSIS — E782 Mixed hyperlipidemia: Secondary | ICD-10-CM | POA: Diagnosis not present

## 2019-12-14 DIAGNOSIS — Z9989 Dependence on other enabling machines and devices: Secondary | ICD-10-CM | POA: Diagnosis not present

## 2019-12-14 DIAGNOSIS — G4733 Obstructive sleep apnea (adult) (pediatric): Secondary | ICD-10-CM | POA: Diagnosis not present

## 2019-12-14 DIAGNOSIS — R55 Syncope and collapse: Secondary | ICD-10-CM | POA: Diagnosis not present

## 2019-12-15 DIAGNOSIS — M5416 Radiculopathy, lumbar region: Secondary | ICD-10-CM | POA: Diagnosis not present

## 2019-12-15 DIAGNOSIS — S52124D Nondisplaced fracture of head of right radius, subsequent encounter for closed fracture with routine healing: Secondary | ICD-10-CM | POA: Diagnosis not present

## 2020-01-20 DIAGNOSIS — H353211 Exudative age-related macular degeneration, right eye, with active choroidal neovascularization: Secondary | ICD-10-CM | POA: Diagnosis not present

## 2020-02-02 DIAGNOSIS — M25511 Pain in right shoulder: Secondary | ICD-10-CM | POA: Diagnosis not present

## 2020-02-02 DIAGNOSIS — Z125 Encounter for screening for malignant neoplasm of prostate: Secondary | ICD-10-CM | POA: Diagnosis not present

## 2020-02-02 DIAGNOSIS — I10 Essential (primary) hypertension: Secondary | ICD-10-CM | POA: Diagnosis not present

## 2020-02-02 DIAGNOSIS — M19011 Primary osteoarthritis, right shoulder: Secondary | ICD-10-CM | POA: Diagnosis not present

## 2020-02-02 DIAGNOSIS — G4733 Obstructive sleep apnea (adult) (pediatric): Secondary | ICD-10-CM | POA: Diagnosis not present

## 2020-02-02 DIAGNOSIS — I25810 Atherosclerosis of coronary artery bypass graft(s) without angina pectoris: Secondary | ICD-10-CM | POA: Diagnosis not present

## 2020-02-02 DIAGNOSIS — Z79899 Other long term (current) drug therapy: Secondary | ICD-10-CM | POA: Diagnosis not present

## 2020-02-02 DIAGNOSIS — E782 Mixed hyperlipidemia: Secondary | ICD-10-CM | POA: Diagnosis not present

## 2020-02-22 DIAGNOSIS — R2689 Other abnormalities of gait and mobility: Secondary | ICD-10-CM | POA: Diagnosis not present

## 2020-02-22 DIAGNOSIS — R4189 Other symptoms and signs involving cognitive functions and awareness: Secondary | ICD-10-CM | POA: Diagnosis not present

## 2020-02-22 DIAGNOSIS — G609 Hereditary and idiopathic neuropathy, unspecified: Secondary | ICD-10-CM | POA: Diagnosis not present

## 2020-03-02 DIAGNOSIS — H353211 Exudative age-related macular degeneration, right eye, with active choroidal neovascularization: Secondary | ICD-10-CM | POA: Diagnosis not present

## 2020-03-08 ENCOUNTER — Ambulatory Visit: Payer: Medicare HMO | Admitting: Dermatology

## 2020-03-08 ENCOUNTER — Other Ambulatory Visit: Payer: Self-pay

## 2020-03-08 DIAGNOSIS — L578 Other skin changes due to chronic exposure to nonionizing radiation: Secondary | ICD-10-CM

## 2020-03-08 DIAGNOSIS — L820 Inflamed seborrheic keratosis: Secondary | ICD-10-CM | POA: Diagnosis not present

## 2020-03-08 DIAGNOSIS — L570 Actinic keratosis: Secondary | ICD-10-CM | POA: Diagnosis not present

## 2020-03-08 NOTE — Patient Instructions (Signed)
Cryotherapy Aftercare  . Wash gently with soap and water everyday.   . Apply Vaseline and Band-Aid daily until healed.  

## 2020-03-08 NOTE — Progress Notes (Signed)
   Follow-Up Visit   Subjective  Robert Reynolds is a 86 y.o. male who presents for the following: Spots (scalp, R post auricular neck. Irritating.). He has a hx of AKs.   The following portions of the chart were reviewed this encounter and updated as appropriate:      Review of Systems:  No other skin or systemic complaints except as noted in HPI or Assessment and Plan.  Objective  Well appearing patient in no apparent distress; mood and affect are within normal limits.  A focused examination was performed including scalp, face. Relevant physical exam findings are noted in the Assessment and Plan.  Objective  Right Post auricular x 1: Erythematous keratotic or waxy stuck-on papule   Objective  Crown scalp x 4 (4): Keratotic papules.   Assessment & Plan  Inflamed seborrheic keratosis Right Post auricular x 1  Destruction of lesion - Right Post auricular x 1  Destruction method: cryotherapy   Informed consent: discussed and consent obtained   Lesion destroyed using liquid nitrogen: Yes   Region frozen until ice ball extended beyond lesion: Yes   Outcome: patient tolerated procedure well with no complications   Post-procedure details: wound care instructions given    AK (actinic keratosis) (4) Crown scalp x 4  Destruction of lesion - Crown scalp x 4  Destruction method: cryotherapy   Informed consent: discussed and consent obtained   Lesion destroyed using liquid nitrogen: Yes   Region frozen until ice ball extended beyond lesion: Yes   Outcome: patient tolerated procedure well with no complications   Post-procedure details: wound care instructions given    Actinic Damage - chronic, secondary to cumulative UV radiation exposure/sun exposure over time to scalp - diffuse scaly erythematous macules with underlying dyspigmentation - Recommend daily broad spectrum sunscreen SPF 30+ to sun-exposed areas, reapply every 2 hours as needed.  - Call for new or changing  lesions.  Return as scheduled with Dr Raliegh Ip..   I, Jamesetta Orleans, CMA, am acting as scribe for Brendolyn Patty, MD .  Documentation: I have reviewed the above documentation for accuracy and completeness, and I agree with the above.  Brendolyn Patty MD

## 2020-03-24 DIAGNOSIS — M7062 Trochanteric bursitis, left hip: Secondary | ICD-10-CM | POA: Diagnosis not present

## 2020-04-07 DIAGNOSIS — R4189 Other symptoms and signs involving cognitive functions and awareness: Secondary | ICD-10-CM | POA: Diagnosis not present

## 2020-04-07 DIAGNOSIS — R2689 Other abnormalities of gait and mobility: Secondary | ICD-10-CM | POA: Diagnosis not present

## 2020-04-07 DIAGNOSIS — G609 Hereditary and idiopathic neuropathy, unspecified: Secondary | ICD-10-CM | POA: Diagnosis not present

## 2020-04-20 DIAGNOSIS — K219 Gastro-esophageal reflux disease without esophagitis: Secondary | ICD-10-CM | POA: Diagnosis not present

## 2020-04-20 DIAGNOSIS — K3184 Gastroparesis: Secondary | ICD-10-CM | POA: Diagnosis not present

## 2020-04-20 DIAGNOSIS — G4733 Obstructive sleep apnea (adult) (pediatric): Secondary | ICD-10-CM | POA: Diagnosis not present

## 2020-04-20 DIAGNOSIS — Z9989 Dependence on other enabling machines and devices: Secondary | ICD-10-CM | POA: Diagnosis not present

## 2020-04-20 DIAGNOSIS — I25810 Atherosclerosis of coronary artery bypass graft(s) without angina pectoris: Secondary | ICD-10-CM | POA: Diagnosis not present

## 2020-04-26 DIAGNOSIS — H353211 Exudative age-related macular degeneration, right eye, with active choroidal neovascularization: Secondary | ICD-10-CM | POA: Diagnosis not present

## 2020-05-04 DIAGNOSIS — I10 Essential (primary) hypertension: Secondary | ICD-10-CM | POA: Diagnosis not present

## 2020-05-04 DIAGNOSIS — E782 Mixed hyperlipidemia: Secondary | ICD-10-CM | POA: Diagnosis not present

## 2020-05-04 DIAGNOSIS — R7309 Other abnormal glucose: Secondary | ICD-10-CM | POA: Diagnosis not present

## 2020-05-04 DIAGNOSIS — Z79899 Other long term (current) drug therapy: Secondary | ICD-10-CM | POA: Diagnosis not present

## 2020-05-04 DIAGNOSIS — R972 Elevated prostate specific antigen [PSA]: Secondary | ICD-10-CM | POA: Diagnosis not present

## 2020-05-12 DIAGNOSIS — E782 Mixed hyperlipidemia: Secondary | ICD-10-CM | POA: Diagnosis not present

## 2020-05-12 DIAGNOSIS — I25810 Atherosclerosis of coronary artery bypass graft(s) without angina pectoris: Secondary | ICD-10-CM | POA: Diagnosis not present

## 2020-05-12 DIAGNOSIS — G894 Chronic pain syndrome: Secondary | ICD-10-CM | POA: Diagnosis not present

## 2020-05-12 DIAGNOSIS — I10 Essential (primary) hypertension: Secondary | ICD-10-CM | POA: Diagnosis not present

## 2020-05-19 DIAGNOSIS — M7062 Trochanteric bursitis, left hip: Secondary | ICD-10-CM | POA: Diagnosis not present

## 2020-06-07 DIAGNOSIS — Z9989 Dependence on other enabling machines and devices: Secondary | ICD-10-CM | POA: Diagnosis not present

## 2020-06-07 DIAGNOSIS — I25810 Atherosclerosis of coronary artery bypass graft(s) without angina pectoris: Secondary | ICD-10-CM | POA: Diagnosis not present

## 2020-06-07 DIAGNOSIS — I10 Essential (primary) hypertension: Secondary | ICD-10-CM | POA: Diagnosis not present

## 2020-06-07 DIAGNOSIS — I739 Peripheral vascular disease, unspecified: Secondary | ICD-10-CM | POA: Diagnosis not present

## 2020-06-07 DIAGNOSIS — E782 Mixed hyperlipidemia: Secondary | ICD-10-CM | POA: Diagnosis not present

## 2020-06-07 DIAGNOSIS — R55 Syncope and collapse: Secondary | ICD-10-CM | POA: Diagnosis not present

## 2020-06-07 DIAGNOSIS — G4733 Obstructive sleep apnea (adult) (pediatric): Secondary | ICD-10-CM | POA: Diagnosis not present

## 2020-06-14 ENCOUNTER — Other Ambulatory Visit: Payer: Self-pay

## 2020-06-14 ENCOUNTER — Ambulatory Visit: Payer: Medicare HMO | Admitting: Dermatology

## 2020-06-14 DIAGNOSIS — L570 Actinic keratosis: Secondary | ICD-10-CM

## 2020-06-14 DIAGNOSIS — C4441 Basal cell carcinoma of skin of scalp and neck: Secondary | ICD-10-CM | POA: Diagnosis not present

## 2020-06-14 DIAGNOSIS — C44311 Basal cell carcinoma of skin of nose: Secondary | ICD-10-CM | POA: Diagnosis not present

## 2020-06-14 DIAGNOSIS — D485 Neoplasm of uncertain behavior of skin: Secondary | ICD-10-CM | POA: Diagnosis not present

## 2020-06-14 DIAGNOSIS — L821 Other seborrheic keratosis: Secondary | ICD-10-CM | POA: Diagnosis not present

## 2020-06-14 DIAGNOSIS — D489 Neoplasm of uncertain behavior, unspecified: Secondary | ICD-10-CM

## 2020-06-14 NOTE — Patient Instructions (Signed)
Cryotherapy Aftercare  . Wash gently with soap and water everyday.   Marland Kitchen Apply Vaseline and Band-Aid daily until healed.  Biopsy Wound Care Instructions  1. Leave the original bandage on for 24 hours if possible.  If the bandage becomes soaked or soiled before that time, it is OK to remove it and examine the wound.  A small amount of post-operative bleeding is normal.  If excessive bleeding occurs, remove the bandage, place gauze over the site and apply continuous pressure (no peeking) over the area for 30 minutes. If this does not work, please call our clinic as soon as possible or page your doctor if it is after hours.   2. Once a day, cleanse the wound with soap and water. It is fine to shower. If a thick crust develops you may use a Q-tip dipped into dilute hydrogen peroxide (mix 1:1 with water) to dissolve it.  Hydrogen peroxide can slow the healing process, so use it only as needed.    3. After washing, apply petroleum jelly (Vaseline) or an antibiotic ointment if your doctor prescribed one for you, followed by a bandage.    4. For best healing, the wound should be covered with a layer of ointment at all times. If you are not able to keep the area covered with a bandage to hold the ointment in place, this may mean re-applying the ointment several times a day.  Continue this wound care until the wound has healed and is no longer open.   Itching and mild discomfort is normal during the healing process. However, if you develop pain or severe itching, please call our office.   If you have any discomfort, you can take Tylenol (acetaminophen) or ibuprofen as directed on the bottle. (Please do not take these if you have an allergy to them or cannot take them for another reason).  Some redness, tenderness and white or yellow material in the wound is normal healing.  If the area becomes very sore and red, or develops a thick yellow-green material (pus), it may be infected; please notify us.    If you  have stitches, return to clinic as directed to have the stitches removed. You will continue wound care for 2-3 days after the stitches are removed.   Wound healing continues for up to one year following surgery. It is not unusual to experience pain in the scar from time to time during the interval.  If the pain becomes severe or the scar thickens, you should notify the office.    A slight amount of redness in a scar is expected for the first six months.  After six months, the redness will fade and the scar will soften and fade.  The color difference becomes less noticeable with time.  If there are any problems, return for a post-op surgery check at your earliest convenience.  To improve the appearance of the scar, you can use silicone scar gel, cream, or sheets (such as Mederma or Serica) every night for up to one year. These are available over the counter (without a prescription).  Please call our office at [phone] for any questions or concerns.

## 2020-06-14 NOTE — Progress Notes (Signed)
Follow-Up Visit   Subjective  Robert Reynolds is a 87 y.o. male who presents for the following: Follow-up (Patient is here today for 3 spots 1 on right cheek, 1 mid scalp, and left forehead. States he noticed areas about a month ago. ).  The following portions of the chart were reviewed this encounter and updated as appropriate:  Tobacco  Allergies  Meds  Problems  Med Hx  Surg Hx  Fam Hx      Objective  Well appearing patient in no apparent distress; mood and affect are within normal limits.  A focused examination was performed including scalp, forehead, face, nose. Relevant physical exam findings are noted in the Assessment and Plan.  Objective  frontal scalp x 1, right vertex x 1, mid vertex x 1, left vertex x 1 (4): Erythematous thin papules/macules with gritty scale.   Objective  A left parietal scalp anterior: 0.3 cm tan papule with Telangiectasia      Objective  B Left Parietal Scalp posterior: 0.4 cm thin medium to dark brown papule       Objective  left nasal bridge: 0.9 cm pink papule      Assessment & Plan  Actinic keratosis (4) frontal scalp x 1, right vertex x 1, mid vertex x 1, left vertex x 1  Prior to procedure, discussed risks of blister formation, small wound, skin dyspigmentation, or rare scar following cryotherapy.    Destruction of lesion - frontal scalp x 1, right vertex x 1, mid vertex x 1, left vertex x 1  Destruction method: cryotherapy   Informed consent: discussed and consent obtained   Lesion destroyed using liquid nitrogen: Yes   Cryotherapy cycles:  2 Outcome: patient tolerated procedure well with no complications   Post-procedure details: wound care instructions given    Neoplasm of uncertain behavior (3) A left parietal scalp anterior  Skin / nail biopsy Type of biopsy: tangential   Informed consent: discussed and consent obtained   Timeout: patient name, date of birth, surgical site, and procedure verified    Patient was prepped and draped in usual sterile fashion: Area prepped with isopropyl alcohol. Anesthesia: the lesion was anesthetized in a standard fashion   Anesthetic:  1% lidocaine w/ epinephrine 1-100,000 buffered w/ 8.4% NaHCO3 Instrument used: DermaBlade   Hemostasis achieved with: aluminum chloride   Outcome: patient tolerated procedure well   Post-procedure details: wound care instructions given   Additional details:  Mupirocin and a bandage applied  Specimen 1 - Surgical pathology Differential Diagnosis: r/o bcc  Check Margins: No 0.3 cm tan papule with Telangiectasia r/o bcc  B Left Parietal Scalp posterior  Epidermal / dermal shaving  Lesion diameter (cm):  0.4 Informed consent: discussed and consent obtained   Timeout: patient name, date of birth, surgical site, and procedure verified   Patient was prepped and draped in usual sterile fashion: area prepped with isopropyl alcohol. Anesthesia: the lesion was anesthetized in a standard fashion   Anesthetic:  1% lidocaine w/ epinephrine 1-100,000 buffered w/ 8.4% NaHCO3 Instrument used: DermaBlade   Hemostasis achieved with: aluminum chloride   Outcome: patient tolerated procedure well   Post-procedure details: wound care instructions given   Additional details:  Mupirocin and a bandage applied  Specimen 2 - Surgical pathology Differential Diagnosis: r/o atypia   Check Margins: No 0.4 cm thin medium to dark brown papule  left nasal bridge  Skin / nail biopsy Type of biopsy: tangential   Informed consent: discussed and consent  obtained   Timeout: patient name, date of birth, surgical site, and procedure verified   Patient was prepped and draped in usual sterile fashion: Area prepped with isopropyl alcohol. Anesthesia: the lesion was anesthetized in a standard fashion   Anesthetic:  1% lidocaine w/ epinephrine 1-100,000 buffered w/ 8.4% NaHCO3 Instrument used: DermaBlade   Hemostasis achieved with: aluminum  chloride   Outcome: patient tolerated procedure well   Post-procedure details: wound care instructions given   Additional details:  Mupirocin and a bandage applied  Specimen 3 - Surgical pathology Differential Diagnosis: r/o bcc  Check Margins: No 0.9 cm pink papule  A. Left parietal scalp anterior - r/o bcc  B. Left parietal scalp posterior -  r/o atypia   left nasal bridge  R/o bcc   Seborrheic Keratoses Right temple  - Stuck-on, waxy, tan-brown papules and plaques  - Discussed benign etiology and prognosis. - Observe - Call for any changes   Return for keep follow up as schedule with Dr. Raliegh Ip 09/13/20.  I, Ruthell Rummage, CMA, am acting as scribe for Forest Gleason, MD.  Documentation: I have reviewed the above documentation for accuracy and completeness, and I agree with the above.  Forest Gleason, MD

## 2020-06-15 DIAGNOSIS — C4491 Basal cell carcinoma of skin, unspecified: Secondary | ICD-10-CM

## 2020-06-15 HISTORY — DX: Basal cell carcinoma of skin, unspecified: C44.91

## 2020-06-19 ENCOUNTER — Encounter: Payer: Self-pay | Admitting: Dermatology

## 2020-06-26 ENCOUNTER — Other Ambulatory Visit: Payer: Self-pay

## 2020-06-26 DIAGNOSIS — C44310 Basal cell carcinoma of skin of unspecified parts of face: Secondary | ICD-10-CM

## 2020-06-27 ENCOUNTER — Telehealth: Payer: Self-pay

## 2020-06-27 NOTE — Telephone Encounter (Signed)
-----   Message from Alfonso Patten, MD sent at 06/20/2020  4:11 PM EST ----- 1. Skin , left parietal scalp anterior BASAL CELL CARCINOMA, NODULAR PATTERN -->  ED&C  2. Skin , left parietal scalp posterior PIGMENTED SEBORRHEIC KERATOSIS -->   This is a benign growth or "wisdom spot". No additional treatment is needed.   3. Skin , left nasal bridge BASAL CELL CARCINOMA, SUPERFICIAL AND NODULAR PATTERNS -->  Mohs surgery at [place]  Recommend Nicotinamide 500mg  twice per day to lower risk of non-melanoma skin cancer by approximately 25%.   Dr. Laurence Ferrari discussed results and treatment plan.  Patient is in agreement.  MAs refer for Mohs and please call to schedule ED&C. Thank you!

## 2020-06-27 NOTE — Telephone Encounter (Signed)
Patient advised referral has been sent and in process at [facility].   Patient scheduled for [facility] with Dr. Laurence Ferrari.

## 2020-06-28 DIAGNOSIS — H353211 Exudative age-related macular degeneration, right eye, with active choroidal neovascularization: Secondary | ICD-10-CM | POA: Diagnosis not present

## 2020-06-28 DIAGNOSIS — M5136 Other intervertebral disc degeneration, lumbar region: Secondary | ICD-10-CM | POA: Diagnosis not present

## 2020-06-28 DIAGNOSIS — M5416 Radiculopathy, lumbar region: Secondary | ICD-10-CM | POA: Diagnosis not present

## 2020-06-28 DIAGNOSIS — H353122 Nonexudative age-related macular degeneration, left eye, intermediate dry stage: Secondary | ICD-10-CM | POA: Diagnosis not present

## 2020-06-28 DIAGNOSIS — M1612 Unilateral primary osteoarthritis, left hip: Secondary | ICD-10-CM | POA: Diagnosis not present

## 2020-06-30 DIAGNOSIS — M1612 Unilateral primary osteoarthritis, left hip: Secondary | ICD-10-CM | POA: Diagnosis not present

## 2020-07-25 ENCOUNTER — Other Ambulatory Visit: Payer: Self-pay

## 2020-07-25 ENCOUNTER — Ambulatory Visit: Payer: Medicare HMO | Admitting: Dermatology

## 2020-07-25 ENCOUNTER — Encounter: Payer: Self-pay | Admitting: Dermatology

## 2020-07-25 DIAGNOSIS — C4441 Basal cell carcinoma of skin of scalp and neck: Secondary | ICD-10-CM | POA: Diagnosis not present

## 2020-07-25 NOTE — Progress Notes (Signed)
   Follow-Up Visit   Subjective  Robert Reynolds is a 87 y.o. male who presents for the following: Procedure (Patient here today for [facility] of bx proven BCC at left parietal scalp anterior.).  Patient is scheduled at [place] for [facility] 08/17/20 to treat bx proven BCC at nasal bridge.  The following portions of the chart were reviewed this encounter and updated as appropriate:   Tobacco  Allergies  Meds  Problems  Med Hx  Surg Hx  Fam Hx      Review of Systems:  No other skin or systemic complaints except as noted in HPI or Assessment and Plan.  Objective  Well appearing patient in no apparent distress; mood and affect are within normal limits.  A focused examination was performed including face, scalp. Relevant physical exam findings are noted in the Assessment and Plan.  Objective  left parietal scalp anterior: Healing bx site   Assessment & Plan  Basal cell carcinoma (BCC) of scalp left parietal scalp anterior  Destruction of lesion  Destruction method: electrodesiccation and curettage   Informed consent: discussed and consent obtained   Timeout:  patient name, date of birth, surgical site, and procedure verified Patient was prepped and draped in usual sterile fashion: area prepped with isopropyl alcohol. Anesthesia: the lesion was anesthetized in a standard fashion   Anesthetic:  1% lidocaine w/ epinephrine 1-100,000 buffered w/ 8.4% NaHCO3 Curettage performed in three different directions: Yes   Electrodesiccation performed over the curetted area: Yes   Curettage cycles:  3 Final wound size (cm):  2.2 Hemostasis achieved with:  electrodesiccation Outcome: patient tolerated procedure well with no complications   Post-procedure details: wound care instructions given   Additional details:  Mupirocin and a pressure dressing applied  Return for TBSE as scheduled.  Graciella Belton, RMA, am acting as scribe for Forest Gleason, MD .  Documentation: I have  reviewed the above documentation for accuracy and completeness, and I agree with the above.  Forest Gleason, MD

## 2020-07-25 NOTE — Patient Instructions (Signed)
If you have any questions or concerns for your doctor, please call our main line at [phone] and press option 4 to reach your doctor's medical assistant. If no one answers, please leave a voicemail as directed and we will return your call as soon as possible. Messages left after 4 pm will be answered the following business day.   You may also send us a message via MyChart. We typically respond to MyChart messages within 1-2 business days.  For prescription refills, please ask your pharmacy to contact our office. Our fax number is [phone].  If you have an urgent issue when the clinic is closed that cannot wait until the next business day, you can page your doctor at the number below.    Please note that while we do our best to be available for urgent issues outside of office hours, we are not available 24/7.   If you have an urgent issue and are unable to reach us, you may choose to seek medical care at your doctor's office, retail clinic, urgent care center, or emergency room.  If you have a medical emergency, please immediately call 911 or go to the emergency department.  Pager Numbers  - Dr. Kowalski: [phone]  - Dr. Moye: [phone]  - Dr. Stewart: [phone]  In the event of inclement weather, please call our main line at [phone] for an update on the status of any delays or closures.  Dermatology Medication Tips: Please keep the boxes that topical medications come in in order to help keep track of the instructions about where and how to use these. Pharmacies typically print the medication instructions only on the boxes and not directly on the medication tubes.   If your medication is too expensive, please contact our office at [phone] option 4 or send us a message through MyChart.   We are unable to tell what your co-pay for medications will be in advance as this is different depending on your insurance coverage. However, we may be able to find a  substitute medication at lower cost or fill out paperwork to get insurance to cover a needed medication.   If a prior authorization is required to get your medication covered by your insurance company, please allow us 1-2 business days to complete this process.  Drug prices often vary depending on where the prescription is filled and some pharmacies may offer cheaper prices.  The website www.goodrx.com contains coupons for medications through different pharmacies. The prices here do not account for what the cost may be with help from insurance (it may be cheaper with your insurance), but the website can give you the price if you did not use any insurance.  - You can print the associated coupon and take it with your prescription to the pharmacy.  - You may also stop by our office during regular business hours and pick up a GoodRx coupon card.  - If you need your prescription sent electronically to a different pharmacy, notify our office through [facility] MyChart or by phone at [phone] option 4.     Wound Care Instructions  1. Cleanse wound gently with soap and water once a day then pat dry with clean gauze. Apply a thing coat of Petrolatum (petroleum jelly, "Vaseline") over the wound (unless you have an allergy to this). We recommend that you use a new, sterile tube of Vaseline. Do not pick or remove scabs. Do not remove the yellow or white "healing tissue" from the base of the wound.    2. Cover the wound with fresh, clean, nonstick gauze and secure with paper tape. You may use Band-Aids in place of gauze and tape if the would is small enough, but would recommend trimming much of the tape off as there is often too much. Sometimes Band-Aids can irritate the skin.  3. You should call the office for your biopsy report after 1 week if you have not already been contacted.  4. If you experience any problems, such as abnormal amounts of bleeding, swelling, significant bruising, significant pain,  or evidence of infection, please call the office immediately.  5. FOR ADULT SURGERY PATIENTS: If you need something for pain relief you may take 1 extra strength Tylenol (acetaminophen) AND 2 Ibuprofen (200mg each) together every 4 hours as needed for pain. (do not take these if you are allergic to them or if you have a reason you should not take them.) Typically, you may only need pain medication for 1 to 3 days.     

## 2020-08-17 DIAGNOSIS — C44311 Basal cell carcinoma of skin of nose: Secondary | ICD-10-CM | POA: Diagnosis not present

## 2020-08-24 DIAGNOSIS — Z0000 Encounter for general adult medical examination without abnormal findings: Secondary | ICD-10-CM | POA: Diagnosis not present

## 2020-08-24 DIAGNOSIS — E785 Hyperlipidemia, unspecified: Secondary | ICD-10-CM | POA: Diagnosis not present

## 2020-08-24 DIAGNOSIS — G4733 Obstructive sleep apnea (adult) (pediatric): Secondary | ICD-10-CM | POA: Diagnosis not present

## 2020-08-24 DIAGNOSIS — I10 Essential (primary) hypertension: Secondary | ICD-10-CM | POA: Diagnosis not present

## 2020-08-24 DIAGNOSIS — I2510 Atherosclerotic heart disease of native coronary artery without angina pectoris: Secondary | ICD-10-CM | POA: Diagnosis not present

## 2020-08-24 DIAGNOSIS — Z79899 Other long term (current) drug therapy: Secondary | ICD-10-CM | POA: Diagnosis not present

## 2020-08-29 DIAGNOSIS — G609 Hereditary and idiopathic neuropathy, unspecified: Secondary | ICD-10-CM | POA: Diagnosis not present

## 2020-08-29 DIAGNOSIS — M5090 Cervical disc disorder, unspecified, unspecified cervical region: Secondary | ICD-10-CM | POA: Diagnosis not present

## 2020-08-29 DIAGNOSIS — G894 Chronic pain syndrome: Secondary | ICD-10-CM | POA: Diagnosis not present

## 2020-08-29 DIAGNOSIS — I2510 Atherosclerotic heart disease of native coronary artery without angina pectoris: Secondary | ICD-10-CM | POA: Diagnosis not present

## 2020-08-29 DIAGNOSIS — M1612 Unilateral primary osteoarthritis, left hip: Secondary | ICD-10-CM | POA: Diagnosis not present

## 2020-08-29 DIAGNOSIS — M5136 Other intervertebral disc degeneration, lumbar region: Secondary | ICD-10-CM | POA: Diagnosis not present

## 2020-08-29 DIAGNOSIS — F419 Anxiety disorder, unspecified: Secondary | ICD-10-CM | POA: Diagnosis not present

## 2020-08-29 DIAGNOSIS — N1831 Chronic kidney disease, stage 3a: Secondary | ICD-10-CM | POA: Diagnosis not present

## 2020-08-29 DIAGNOSIS — I129 Hypertensive chronic kidney disease with stage 1 through stage 4 chronic kidney disease, or unspecified chronic kidney disease: Secondary | ICD-10-CM | POA: Diagnosis not present

## 2020-09-05 DIAGNOSIS — I2510 Atherosclerotic heart disease of native coronary artery without angina pectoris: Secondary | ICD-10-CM | POA: Diagnosis not present

## 2020-09-05 DIAGNOSIS — F419 Anxiety disorder, unspecified: Secondary | ICD-10-CM | POA: Diagnosis not present

## 2020-09-05 DIAGNOSIS — I129 Hypertensive chronic kidney disease with stage 1 through stage 4 chronic kidney disease, or unspecified chronic kidney disease: Secondary | ICD-10-CM | POA: Diagnosis not present

## 2020-09-05 DIAGNOSIS — N1831 Chronic kidney disease, stage 3a: Secondary | ICD-10-CM | POA: Diagnosis not present

## 2020-09-05 DIAGNOSIS — G894 Chronic pain syndrome: Secondary | ICD-10-CM | POA: Diagnosis not present

## 2020-09-05 DIAGNOSIS — M1612 Unilateral primary osteoarthritis, left hip: Secondary | ICD-10-CM | POA: Diagnosis not present

## 2020-09-05 DIAGNOSIS — G609 Hereditary and idiopathic neuropathy, unspecified: Secondary | ICD-10-CM | POA: Diagnosis not present

## 2020-09-05 DIAGNOSIS — M5090 Cervical disc disorder, unspecified, unspecified cervical region: Secondary | ICD-10-CM | POA: Diagnosis not present

## 2020-09-05 DIAGNOSIS — M5136 Other intervertebral disc degeneration, lumbar region: Secondary | ICD-10-CM | POA: Diagnosis not present

## 2020-09-06 DIAGNOSIS — H353211 Exudative age-related macular degeneration, right eye, with active choroidal neovascularization: Secondary | ICD-10-CM | POA: Diagnosis not present

## 2020-09-08 DIAGNOSIS — F419 Anxiety disorder, unspecified: Secondary | ICD-10-CM | POA: Diagnosis not present

## 2020-09-08 DIAGNOSIS — I2510 Atherosclerotic heart disease of native coronary artery without angina pectoris: Secondary | ICD-10-CM | POA: Diagnosis not present

## 2020-09-08 DIAGNOSIS — G894 Chronic pain syndrome: Secondary | ICD-10-CM | POA: Diagnosis not present

## 2020-09-08 DIAGNOSIS — M5136 Other intervertebral disc degeneration, lumbar region: Secondary | ICD-10-CM | POA: Diagnosis not present

## 2020-09-08 DIAGNOSIS — N1831 Chronic kidney disease, stage 3a: Secondary | ICD-10-CM | POA: Diagnosis not present

## 2020-09-08 DIAGNOSIS — M1612 Unilateral primary osteoarthritis, left hip: Secondary | ICD-10-CM | POA: Diagnosis not present

## 2020-09-08 DIAGNOSIS — G609 Hereditary and idiopathic neuropathy, unspecified: Secondary | ICD-10-CM | POA: Diagnosis not present

## 2020-09-08 DIAGNOSIS — M5090 Cervical disc disorder, unspecified, unspecified cervical region: Secondary | ICD-10-CM | POA: Diagnosis not present

## 2020-09-08 DIAGNOSIS — I129 Hypertensive chronic kidney disease with stage 1 through stage 4 chronic kidney disease, or unspecified chronic kidney disease: Secondary | ICD-10-CM | POA: Diagnosis not present

## 2020-09-11 DIAGNOSIS — M1612 Unilateral primary osteoarthritis, left hip: Secondary | ICD-10-CM | POA: Diagnosis not present

## 2020-09-11 DIAGNOSIS — M48062 Spinal stenosis, lumbar region with neurogenic claudication: Secondary | ICD-10-CM | POA: Diagnosis not present

## 2020-09-11 DIAGNOSIS — M5416 Radiculopathy, lumbar region: Secondary | ICD-10-CM | POA: Diagnosis not present

## 2020-09-11 DIAGNOSIS — M5136 Other intervertebral disc degeneration, lumbar region: Secondary | ICD-10-CM | POA: Diagnosis not present

## 2020-09-12 DIAGNOSIS — M5136 Other intervertebral disc degeneration, lumbar region: Secondary | ICD-10-CM | POA: Diagnosis not present

## 2020-09-12 DIAGNOSIS — G609 Hereditary and idiopathic neuropathy, unspecified: Secondary | ICD-10-CM | POA: Diagnosis not present

## 2020-09-12 DIAGNOSIS — G894 Chronic pain syndrome: Secondary | ICD-10-CM | POA: Diagnosis not present

## 2020-09-12 DIAGNOSIS — M1612 Unilateral primary osteoarthritis, left hip: Secondary | ICD-10-CM | POA: Diagnosis not present

## 2020-09-12 DIAGNOSIS — N1831 Chronic kidney disease, stage 3a: Secondary | ICD-10-CM | POA: Diagnosis not present

## 2020-09-12 DIAGNOSIS — I2510 Atherosclerotic heart disease of native coronary artery without angina pectoris: Secondary | ICD-10-CM | POA: Diagnosis not present

## 2020-09-12 DIAGNOSIS — I129 Hypertensive chronic kidney disease with stage 1 through stage 4 chronic kidney disease, or unspecified chronic kidney disease: Secondary | ICD-10-CM | POA: Diagnosis not present

## 2020-09-12 DIAGNOSIS — M5090 Cervical disc disorder, unspecified, unspecified cervical region: Secondary | ICD-10-CM | POA: Diagnosis not present

## 2020-09-12 DIAGNOSIS — F419 Anxiety disorder, unspecified: Secondary | ICD-10-CM | POA: Diagnosis not present

## 2020-09-13 ENCOUNTER — Ambulatory Visit (INDEPENDENT_AMBULATORY_CARE_PROVIDER_SITE_OTHER): Payer: Medicare HMO | Admitting: Dermatology

## 2020-09-13 ENCOUNTER — Other Ambulatory Visit: Payer: Self-pay

## 2020-09-13 ENCOUNTER — Encounter: Payer: Self-pay | Admitting: Dermatology

## 2020-09-13 DIAGNOSIS — L814 Other melanin hyperpigmentation: Secondary | ICD-10-CM | POA: Diagnosis not present

## 2020-09-13 DIAGNOSIS — D1800 Hemangioma unspecified site: Secondary | ICD-10-CM

## 2020-09-13 DIAGNOSIS — Z85828 Personal history of other malignant neoplasm of skin: Secondary | ICD-10-CM | POA: Diagnosis not present

## 2020-09-13 DIAGNOSIS — L570 Actinic keratosis: Secondary | ICD-10-CM

## 2020-09-13 DIAGNOSIS — L821 Other seborrheic keratosis: Secondary | ICD-10-CM

## 2020-09-13 DIAGNOSIS — L578 Other skin changes due to chronic exposure to nonionizing radiation: Secondary | ICD-10-CM | POA: Diagnosis not present

## 2020-09-13 DIAGNOSIS — D229 Melanocytic nevi, unspecified: Secondary | ICD-10-CM

## 2020-09-13 DIAGNOSIS — Z1283 Encounter for screening for malignant neoplasm of skin: Secondary | ICD-10-CM | POA: Diagnosis not present

## 2020-09-13 DIAGNOSIS — L820 Inflamed seborrheic keratosis: Secondary | ICD-10-CM

## 2020-09-13 NOTE — Progress Notes (Signed)
Follow-Up Visit   Subjective  Robert Reynolds is a 87 y.o. male who presents for the following: Annual Exam (Mole check, Hx of BCC ). The patient presents for Total-Body Skin Exam (TBSE) for skin cancer screening and mole check.  The following portions of the chart were reviewed this encounter and updated as appropriate:   Tobacco  Allergies  Meds  Problems  Med Hx  Surg Hx  Fam Hx     Review of Systems:  No other skin or systemic complaints except as noted in HPI or Assessment and Plan.  Objective  Well appearing patient in no apparent distress; mood and affect are within normal limits.  A full examination was performed including scalp, head, eyes, ears, nose, lips, neck, chest, axillae, abdomen, back, buttocks, bilateral upper extremities, bilateral lower extremities, hands, feet, fingers, toes, fingernails, and toenails. All findings within normal limits unless otherwise noted below.  Objective  Scalp, ears (12): Erythematous thin papules/macules with gritty scale.   Objective  Right Ear x 1: Erythematous keratotic or waxy stuck-on papule or plaque.    Assessment & Plan  AK (actinic keratosis) (12) Scalp, ears  Destruction of lesion - Scalp, ears Complexity: simple   Destruction method: cryotherapy   Informed consent: discussed and consent obtained   Timeout:  patient name, date of birth, surgical site, and procedure verified Lesion destroyed using liquid nitrogen: Yes   Region frozen until ice ball extended beyond lesion: Yes   Outcome: patient tolerated procedure well with no complications   Post-procedure details: wound care instructions given    Inflamed seborrheic keratosis Right Ear x 1  Destruction of lesion - Right Ear x 1 Complexity: simple   Destruction method: cryotherapy   Informed consent: discussed and consent obtained   Timeout:  patient name, date of birth, surgical site, and procedure verified Lesion destroyed using liquid nitrogen: Yes    Region frozen until ice ball extended beyond lesion: Yes   Outcome: patient tolerated procedure well with no complications   Post-procedure details: wound care instructions given    Skin cancer screening   Lentigines - Scattered tan macules - Due to sun exposure - Benign-appering, observe - Recommend daily broad spectrum sunscreen SPF 30+ to sun-exposed areas, reapply every 2 hours as needed. - Call for any changes  Seborrheic Keratoses - Stuck-on, waxy, tan-brown papules and/or plaques  - Benign-appearing - Discussed benign etiology and prognosis. - Observe - Call for any changes  Melanocytic Nevi - Tan-brown and/or pink-flesh-colored symmetric macules and papules - Benign appearing on exam today - Observation - Call clinic for new or changing moles - Recommend daily use of broad spectrum spf 30+ sunscreen to sun-exposed areas.   Hemangiomas - Red papules - Discussed benign nature - Observe - Call for any changes  Actinic Damage - Chronic condition, secondary to cumulative UV/sun exposure - diffuse scaly erythematous macules with underlying dyspigmentation - Recommend daily broad spectrum sunscreen SPF 30+ to sun-exposed areas, reapply every 2 hours as needed.  - Staying in the shade or wearing long sleeves, sun glasses (UVA+UVB protection) and wide brim hats (4-inch brim around the entire circumference of the hat) are also recommended for sun protection.  - Call for new or changing lesions.  History of Basal Cell Carcinoma of the Skin Multiple see history  Mohs surgery nasal bridge 08/17/20 - No evidence of recurrence today - Recommend regular full body skin exams - Recommend daily broad spectrum sunscreen SPF 30+ to sun-exposed areas, reapply every  2 hours as needed.  - Call if any new or changing lesions are noted between office visits  Skin cancer screening performed today.  Return in about 6 months (around 03/16/2021) for UBSE, Hx of BCC.  IMarye Round,  CMA, am acting as scribe for Sarina Ser, MD .  Documentation: I have reviewed the above documentation for accuracy and completeness, and I agree with the above.  Sarina Ser, MD

## 2020-09-13 NOTE — Patient Instructions (Addendum)

## 2020-09-14 DIAGNOSIS — G894 Chronic pain syndrome: Secondary | ICD-10-CM | POA: Diagnosis not present

## 2020-09-14 DIAGNOSIS — F419 Anxiety disorder, unspecified: Secondary | ICD-10-CM | POA: Diagnosis not present

## 2020-09-14 DIAGNOSIS — M5136 Other intervertebral disc degeneration, lumbar region: Secondary | ICD-10-CM | POA: Diagnosis not present

## 2020-09-14 DIAGNOSIS — M5090 Cervical disc disorder, unspecified, unspecified cervical region: Secondary | ICD-10-CM | POA: Diagnosis not present

## 2020-09-14 DIAGNOSIS — N1831 Chronic kidney disease, stage 3a: Secondary | ICD-10-CM | POA: Diagnosis not present

## 2020-09-14 DIAGNOSIS — I2510 Atherosclerotic heart disease of native coronary artery without angina pectoris: Secondary | ICD-10-CM | POA: Diagnosis not present

## 2020-09-14 DIAGNOSIS — M1612 Unilateral primary osteoarthritis, left hip: Secondary | ICD-10-CM | POA: Diagnosis not present

## 2020-09-14 DIAGNOSIS — G609 Hereditary and idiopathic neuropathy, unspecified: Secondary | ICD-10-CM | POA: Diagnosis not present

## 2020-09-14 DIAGNOSIS — I129 Hypertensive chronic kidney disease with stage 1 through stage 4 chronic kidney disease, or unspecified chronic kidney disease: Secondary | ICD-10-CM | POA: Diagnosis not present

## 2020-09-18 DIAGNOSIS — M5416 Radiculopathy, lumbar region: Secondary | ICD-10-CM | POA: Diagnosis not present

## 2020-09-18 DIAGNOSIS — M48062 Spinal stenosis, lumbar region with neurogenic claudication: Secondary | ICD-10-CM | POA: Diagnosis not present

## 2020-09-19 ENCOUNTER — Encounter: Payer: Self-pay | Admitting: Dermatology

## 2020-09-21 DIAGNOSIS — M5136 Other intervertebral disc degeneration, lumbar region: Secondary | ICD-10-CM | POA: Diagnosis not present

## 2020-09-21 DIAGNOSIS — M5090 Cervical disc disorder, unspecified, unspecified cervical region: Secondary | ICD-10-CM | POA: Diagnosis not present

## 2020-09-21 DIAGNOSIS — F419 Anxiety disorder, unspecified: Secondary | ICD-10-CM | POA: Diagnosis not present

## 2020-09-21 DIAGNOSIS — I2510 Atherosclerotic heart disease of native coronary artery without angina pectoris: Secondary | ICD-10-CM | POA: Diagnosis not present

## 2020-09-21 DIAGNOSIS — I129 Hypertensive chronic kidney disease with stage 1 through stage 4 chronic kidney disease, or unspecified chronic kidney disease: Secondary | ICD-10-CM | POA: Diagnosis not present

## 2020-09-21 DIAGNOSIS — G894 Chronic pain syndrome: Secondary | ICD-10-CM | POA: Diagnosis not present

## 2020-09-21 DIAGNOSIS — M1612 Unilateral primary osteoarthritis, left hip: Secondary | ICD-10-CM | POA: Diagnosis not present

## 2020-09-21 DIAGNOSIS — N1831 Chronic kidney disease, stage 3a: Secondary | ICD-10-CM | POA: Diagnosis not present

## 2020-09-21 DIAGNOSIS — G609 Hereditary and idiopathic neuropathy, unspecified: Secondary | ICD-10-CM | POA: Diagnosis not present

## 2020-09-28 ENCOUNTER — Ambulatory Visit (INDEPENDENT_AMBULATORY_CARE_PROVIDER_SITE_OTHER): Payer: Medicare HMO | Admitting: Dermatology

## 2020-09-28 ENCOUNTER — Other Ambulatory Visit: Payer: Self-pay

## 2020-09-28 DIAGNOSIS — N1831 Chronic kidney disease, stage 3a: Secondary | ICD-10-CM | POA: Diagnosis not present

## 2020-09-28 DIAGNOSIS — M5090 Cervical disc disorder, unspecified, unspecified cervical region: Secondary | ICD-10-CM | POA: Diagnosis not present

## 2020-09-28 DIAGNOSIS — M5136 Other intervertebral disc degeneration, lumbar region: Secondary | ICD-10-CM | POA: Diagnosis not present

## 2020-09-28 DIAGNOSIS — M1612 Unilateral primary osteoarthritis, left hip: Secondary | ICD-10-CM | POA: Diagnosis not present

## 2020-09-28 DIAGNOSIS — L989 Disorder of the skin and subcutaneous tissue, unspecified: Secondary | ICD-10-CM | POA: Diagnosis not present

## 2020-09-28 DIAGNOSIS — I2510 Atherosclerotic heart disease of native coronary artery without angina pectoris: Secondary | ICD-10-CM | POA: Diagnosis not present

## 2020-09-28 DIAGNOSIS — F419 Anxiety disorder, unspecified: Secondary | ICD-10-CM | POA: Diagnosis not present

## 2020-09-28 DIAGNOSIS — G609 Hereditary and idiopathic neuropathy, unspecified: Secondary | ICD-10-CM | POA: Diagnosis not present

## 2020-09-28 DIAGNOSIS — G894 Chronic pain syndrome: Secondary | ICD-10-CM | POA: Diagnosis not present

## 2020-09-28 DIAGNOSIS — I129 Hypertensive chronic kidney disease with stage 1 through stage 4 chronic kidney disease, or unspecified chronic kidney disease: Secondary | ICD-10-CM | POA: Diagnosis not present

## 2020-09-28 MED ORDER — MUPIROCIN 2 % EX OINT: 1.0000 "application " | TOPICAL_OINTMENT | Freq: Every day | CUTANEOUS | 0 refills | Status: DC

## 2020-09-28 NOTE — Patient Instructions (Signed)

## 2020-09-28 NOTE — Progress Notes (Signed)
   Follow-Up Visit   Subjective  CORNELIOUS Reynolds is a 87 y.o. male who presents for the following: Follow-up (Patient here today to have [place] site at left parietal scalp anterior checked. Patient had EDC on 07/25/20 to treat bx proven BCC and advises area does not want to heal. ).  He was using vaseline for some time after procedure.   The following portions of the chart were reviewed this encounter and updated as appropriate:   Tobacco  Allergies  Meds  Problems  Med Hx  Surg Hx  Fam Hx      Review of Systems:  No other skin or systemic complaints except as noted in HPI or Assessment and Plan.  Objective  Well appearing patient in no apparent distress; mood and affect are within normal limits.  A focused examination was performed including scalp. Relevant physical exam findings are noted in the Assessment and Plan.  Objective  left parietal scalp anterior: Healing EDC site with crust   Assessment & Plan  Skin problem left parietal scalp anterior  ED&C site clean without signs of infection. Very dry.  Patient reassured.  Restart mupirocin to affected area and cover with band aid.  Advised important to keep moist 24/7 as much as possible to speed healing.   Ordered Medications: mupirocin ointment (BACTROBAN) 2 %  Return for as scheduled.  Graciella Belton, RMA, am acting as scribe for Forest Gleason, MD .  Documentation: I have reviewed the above documentation for accuracy and completeness, and I agree with the above.  Forest Gleason, MD

## 2020-10-02 ENCOUNTER — Encounter: Payer: Self-pay | Admitting: Dermatology

## 2020-10-02 DIAGNOSIS — F419 Anxiety disorder, unspecified: Secondary | ICD-10-CM | POA: Diagnosis not present

## 2020-10-02 DIAGNOSIS — G609 Hereditary and idiopathic neuropathy, unspecified: Secondary | ICD-10-CM | POA: Diagnosis not present

## 2020-10-02 DIAGNOSIS — N1831 Chronic kidney disease, stage 3a: Secondary | ICD-10-CM | POA: Diagnosis not present

## 2020-10-02 DIAGNOSIS — M1612 Unilateral primary osteoarthritis, left hip: Secondary | ICD-10-CM | POA: Diagnosis not present

## 2020-10-02 DIAGNOSIS — G894 Chronic pain syndrome: Secondary | ICD-10-CM | POA: Diagnosis not present

## 2020-10-02 DIAGNOSIS — I2510 Atherosclerotic heart disease of native coronary artery without angina pectoris: Secondary | ICD-10-CM | POA: Diagnosis not present

## 2020-10-02 DIAGNOSIS — M5136 Other intervertebral disc degeneration, lumbar region: Secondary | ICD-10-CM | POA: Diagnosis not present

## 2020-10-02 DIAGNOSIS — I129 Hypertensive chronic kidney disease with stage 1 through stage 4 chronic kidney disease, or unspecified chronic kidney disease: Secondary | ICD-10-CM | POA: Diagnosis not present

## 2020-10-02 DIAGNOSIS — M5090 Cervical disc disorder, unspecified, unspecified cervical region: Secondary | ICD-10-CM | POA: Diagnosis not present

## 2020-10-06 DIAGNOSIS — G44229 Chronic tension-type headache, not intractable: Secondary | ICD-10-CM | POA: Diagnosis not present

## 2020-10-06 DIAGNOSIS — R4189 Other symptoms and signs involving cognitive functions and awareness: Secondary | ICD-10-CM | POA: Diagnosis not present

## 2020-10-06 DIAGNOSIS — G609 Hereditary and idiopathic neuropathy, unspecified: Secondary | ICD-10-CM | POA: Diagnosis not present

## 2020-10-06 DIAGNOSIS — R2689 Other abnormalities of gait and mobility: Secondary | ICD-10-CM | POA: Diagnosis not present

## 2020-10-09 DIAGNOSIS — M5136 Other intervertebral disc degeneration, lumbar region: Secondary | ICD-10-CM | POA: Diagnosis not present

## 2020-10-09 DIAGNOSIS — I2510 Atherosclerotic heart disease of native coronary artery without angina pectoris: Secondary | ICD-10-CM | POA: Diagnosis not present

## 2020-10-09 DIAGNOSIS — I129 Hypertensive chronic kidney disease with stage 1 through stage 4 chronic kidney disease, or unspecified chronic kidney disease: Secondary | ICD-10-CM | POA: Diagnosis not present

## 2020-10-09 DIAGNOSIS — G894 Chronic pain syndrome: Secondary | ICD-10-CM | POA: Diagnosis not present

## 2020-10-09 DIAGNOSIS — N1831 Chronic kidney disease, stage 3a: Secondary | ICD-10-CM | POA: Diagnosis not present

## 2020-10-09 DIAGNOSIS — G609 Hereditary and idiopathic neuropathy, unspecified: Secondary | ICD-10-CM | POA: Diagnosis not present

## 2020-10-09 DIAGNOSIS — M1612 Unilateral primary osteoarthritis, left hip: Secondary | ICD-10-CM | POA: Diagnosis not present

## 2020-10-09 DIAGNOSIS — F419 Anxiety disorder, unspecified: Secondary | ICD-10-CM | POA: Diagnosis not present

## 2020-10-09 DIAGNOSIS — M5090 Cervical disc disorder, unspecified, unspecified cervical region: Secondary | ICD-10-CM | POA: Diagnosis not present

## 2020-10-16 DIAGNOSIS — M48062 Spinal stenosis, lumbar region with neurogenic claudication: Secondary | ICD-10-CM | POA: Diagnosis not present

## 2020-10-16 DIAGNOSIS — M5136 Other intervertebral disc degeneration, lumbar region: Secondary | ICD-10-CM | POA: Diagnosis not present

## 2020-10-16 DIAGNOSIS — M5416 Radiculopathy, lumbar region: Secondary | ICD-10-CM | POA: Diagnosis not present

## 2020-10-16 DIAGNOSIS — M1612 Unilateral primary osteoarthritis, left hip: Secondary | ICD-10-CM | POA: Diagnosis not present

## 2020-11-04 IMAGING — CT CT CERVICAL SPINE W/O CM
3 of 4 series · 12 of 33 positions shown, 14 images · non-contrast
Comparison: C-spine MRI 11/27/2006

CLINICAL DATA: Syncope while standing on a stool, unsure of head
strike

EXAM:
CT HEAD WITHOUT CONTRAST
CT CERVICAL SPINE WITHOUT CONTRAST
TECHNIQUE: Multidetector CT imaging of the head and cervical spine was
performed following the standard protocol without intravenous
contrast. Multiplanar CT image reconstructions of the cervical spine
were also generated.

[Series 6: sagittal bone · sagittal · 0.23mm/px · 5 of 61 slices shown, 6 images]
[im 21/61  bone]
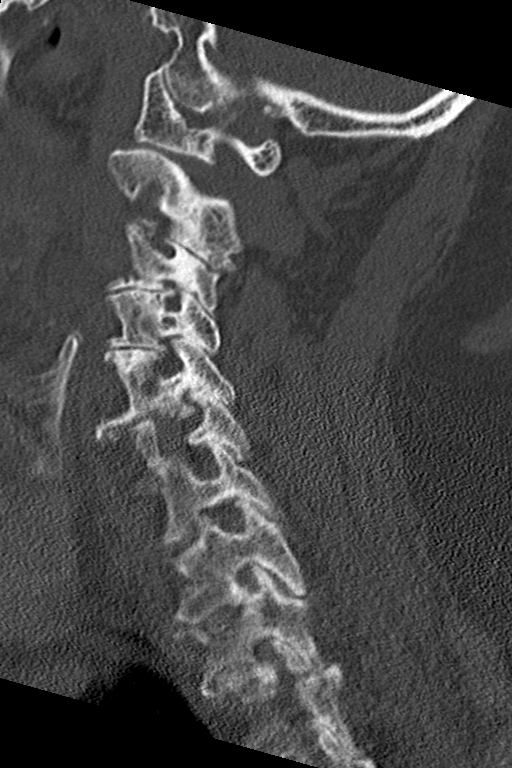
[im 26/61  bone]
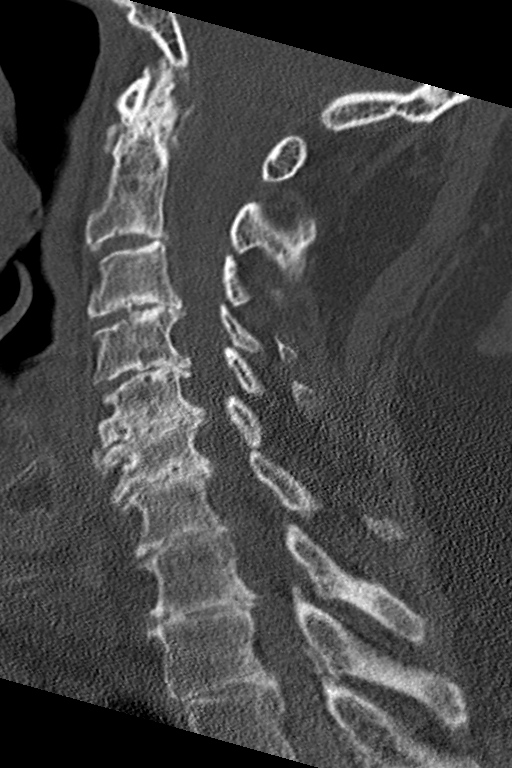
[im 31/61  soft-tissue]
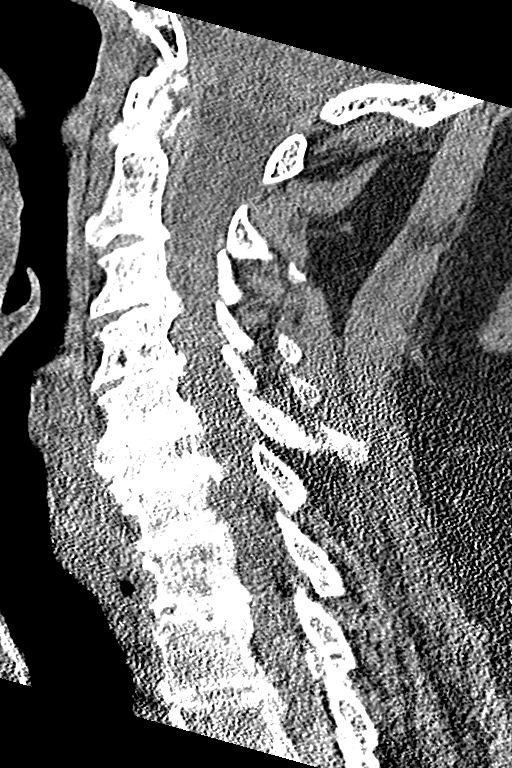
[im 31/61  bone]
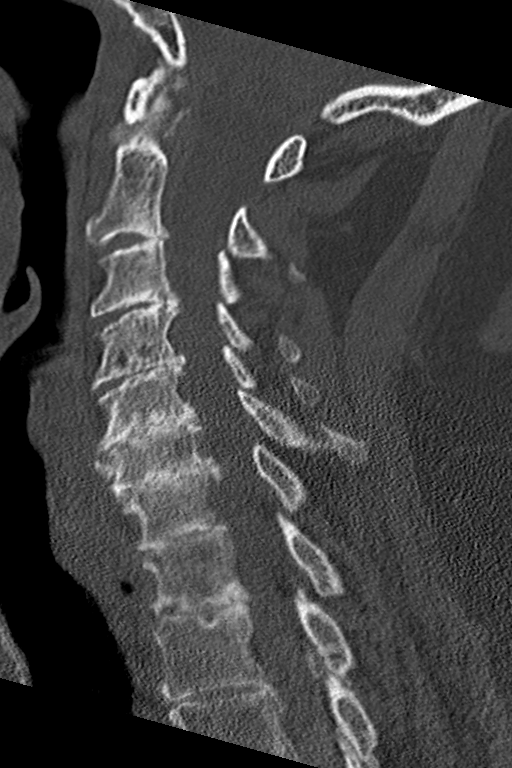
[im 36/61  bone]
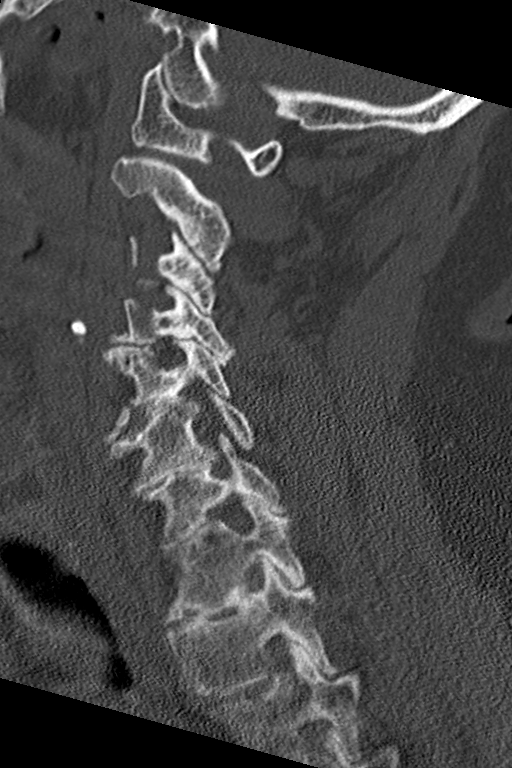
[im 41/61  bone]
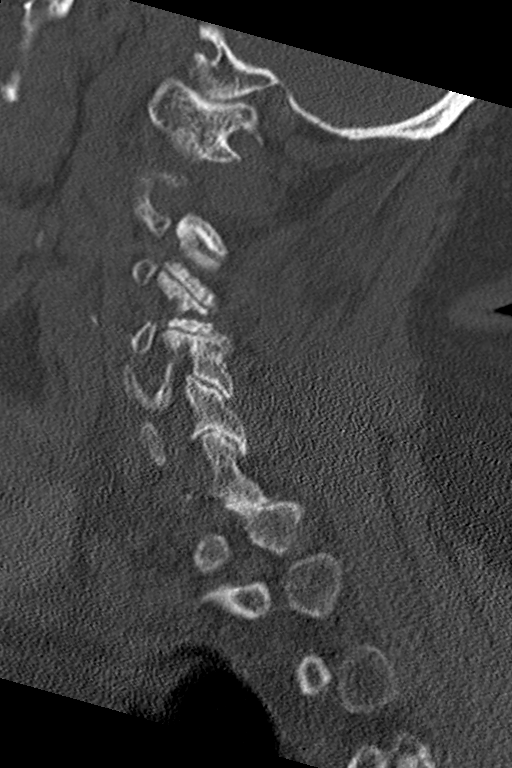

[Series 7: coronal bone · coronal · 0.23mm/px · 3 of 61 slices shown]
[im 13/61  bone]
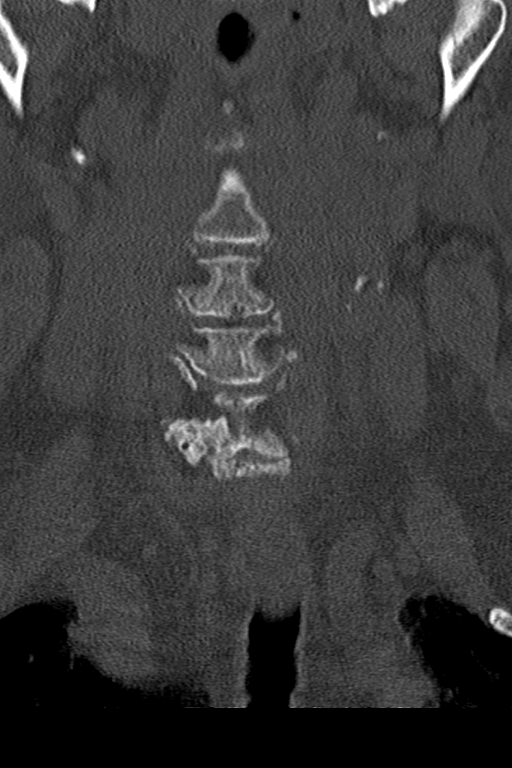
[im 25/61  bone]
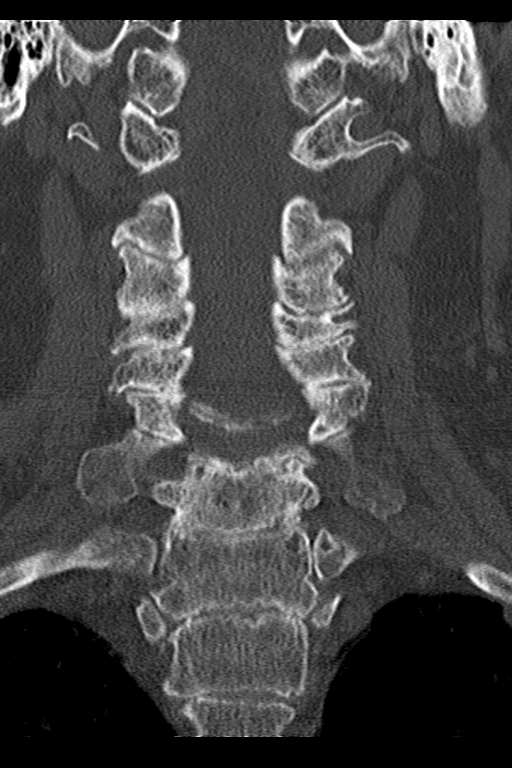
[im 37/61  bone]
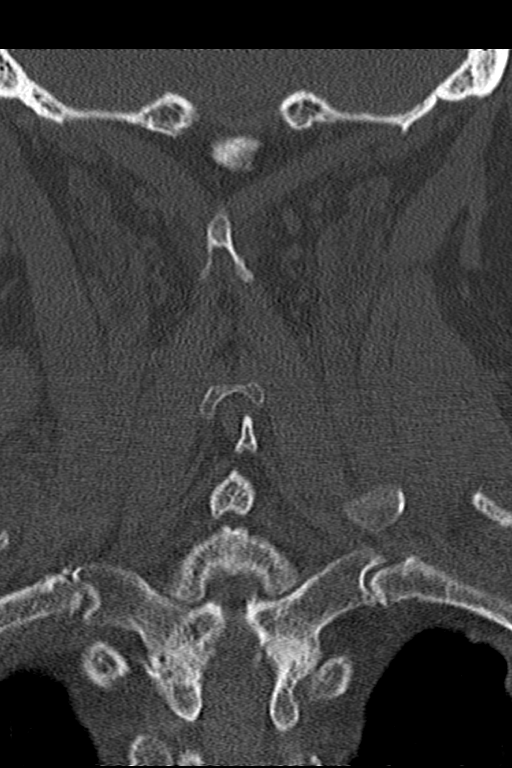

[Series 8: orthogonal bone · axial · 0.23mm/px · z∈[-248,-133]mm · 4 of 91 slices shown, 5 images]
[im 16/91  soft-tissue]
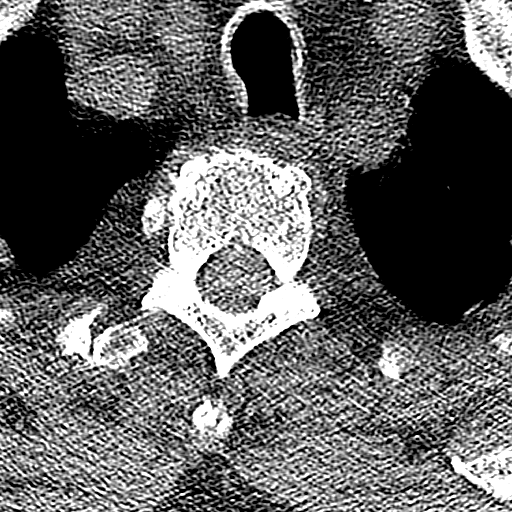
[im 16/91  bone]
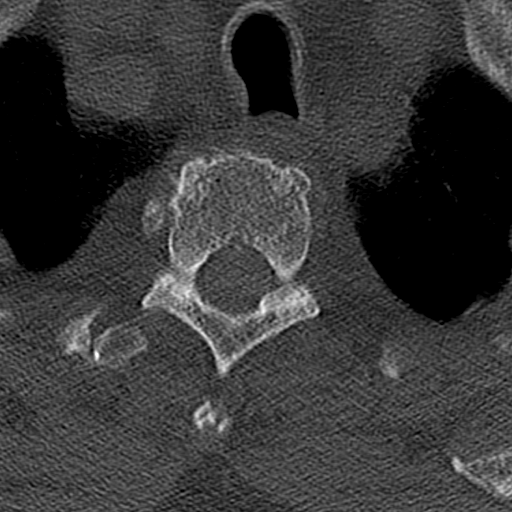
[im 31/91  bone]
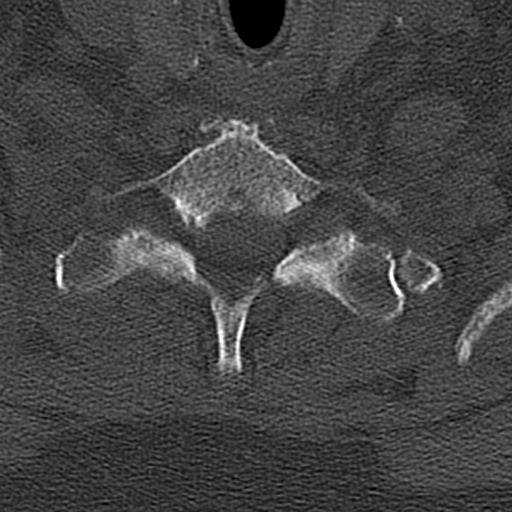
[im 61/91  bone]
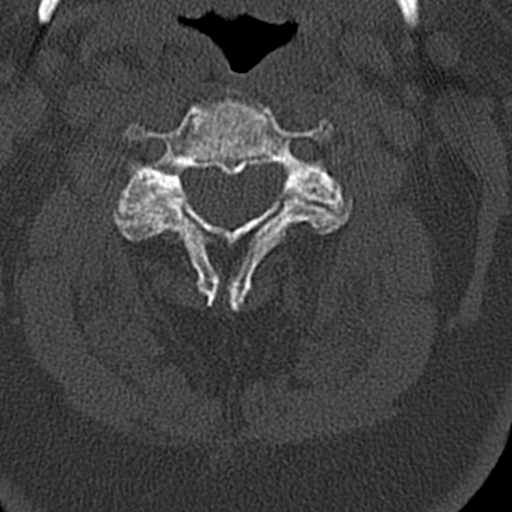
[im 76/91  bone]
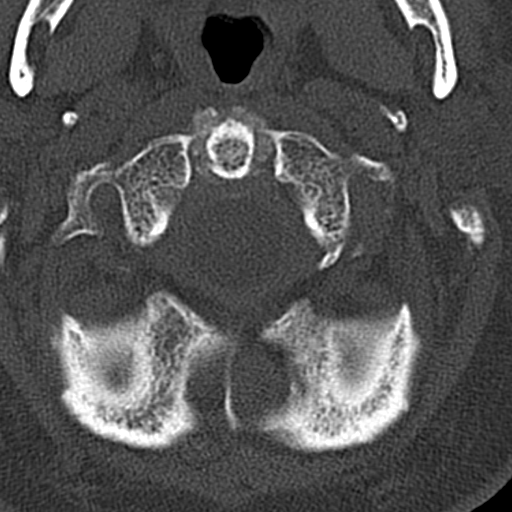

[12 of 33 positions shown; findings below may reference images not displayed]

FINDINGS: CT HEAD FINDINGS

Brain: No evidence of acute infarction, hemorrhage, hydrocephalus,
extra-axial collection or mass lesion/mass effect. Symmetric
prominence of the ventricles, cisterns and sulci compatible with
some frontal predominant parenchymal volume loss. Patchy areas of
white matter hypoattenuation are most compatible with chronic
microvascular angiopathy.

Vascular: Atherosclerotic calcification of the carotid siphons. No
hyperdense vessel.

Skull: No calvarial fracture or suspicious osseous lesion. No scalp
swelling or hematoma.

Sinuses/Orbits: Paranasal sinuses and mastoid air cells are
predominantly clear. Orbital structures are unremarkable aside from
prior lens extractions.

Other: Mild TMJ arthrosis.

CT CERVICAL SPINE FINDINGS

Alignment: Preservation of the normal cervical lordosis without
traumatic listhesis. No abnormal facet widening. Normal alignment of
the craniocervical and atlantoaxial articulations.

Skull base and vertebrae: No acute fracture. No primary bone lesion
or focal pathologic process. Calcified pannus formation upon the
dens.

Soft tissues and spinal canal: No pre or paravertebral fluid or
swelling. No visible canal hematoma.

Disc levels: Multilevel intervertebral disc height loss with
spondylitic endplate changes. Posterior ridging results in mild to
moderate multilevel canal stenosis most pronounced at C5-6. Diffuse
uncinate spurring and facet hypertrophic changes results in
multilevel mild-to-moderate neural foraminal narrowing.

Upper chest: No acute abnormality in the upper chest or imaged lung
apices. Emphysematous changes noted in the lung apices.

Other: Normal thyroid. Atherosclerosis of the cervical carotid
arteries and proximal left subclavian artery.
IMPRESSION: 1. No acute intracranial abnormality.
2. Mild frontal predominant parenchymal volume loss and chronic
microvascular ischemic white matter disease.
3. No acute cervical spine fracture or traumatic listhesis.
4. Moderate to severe multilevel degenerative changes throughout the
cervical spine.

## 2020-11-08 DIAGNOSIS — H353211 Exudative age-related macular degeneration, right eye, with active choroidal neovascularization: Secondary | ICD-10-CM | POA: Diagnosis not present

## 2020-11-20 DIAGNOSIS — M25551 Pain in right hip: Secondary | ICD-10-CM | POA: Diagnosis not present

## 2020-11-20 DIAGNOSIS — M13851 Other specified arthritis, right hip: Secondary | ICD-10-CM | POA: Diagnosis not present

## 2020-12-05 DIAGNOSIS — M545 Low back pain, unspecified: Secondary | ICD-10-CM | POA: Diagnosis not present

## 2020-12-05 DIAGNOSIS — M5416 Radiculopathy, lumbar region: Secondary | ICD-10-CM | POA: Diagnosis not present

## 2020-12-11 DIAGNOSIS — M5416 Radiculopathy, lumbar region: Secondary | ICD-10-CM | POA: Diagnosis not present

## 2020-12-11 DIAGNOSIS — M545 Low back pain, unspecified: Secondary | ICD-10-CM | POA: Diagnosis not present

## 2020-12-19 DIAGNOSIS — M5416 Radiculopathy, lumbar region: Secondary | ICD-10-CM | POA: Diagnosis not present

## 2020-12-20 DIAGNOSIS — G4733 Obstructive sleep apnea (adult) (pediatric): Secondary | ICD-10-CM | POA: Diagnosis not present

## 2020-12-20 DIAGNOSIS — E7800 Pure hypercholesterolemia, unspecified: Secondary | ICD-10-CM | POA: Diagnosis not present

## 2020-12-20 DIAGNOSIS — I10 Essential (primary) hypertension: Secondary | ICD-10-CM | POA: Diagnosis not present

## 2020-12-20 DIAGNOSIS — R55 Syncope and collapse: Secondary | ICD-10-CM | POA: Diagnosis not present

## 2020-12-20 DIAGNOSIS — I2510 Atherosclerotic heart disease of native coronary artery without angina pectoris: Secondary | ICD-10-CM | POA: Diagnosis not present

## 2020-12-20 DIAGNOSIS — Z9989 Dependence on other enabling machines and devices: Secondary | ICD-10-CM | POA: Diagnosis not present

## 2020-12-20 DIAGNOSIS — E785 Hyperlipidemia, unspecified: Secondary | ICD-10-CM | POA: Diagnosis not present

## 2020-12-21 ENCOUNTER — Other Ambulatory Visit: Payer: Self-pay

## 2020-12-21 ENCOUNTER — Ambulatory Visit: Payer: Medicare HMO | Admitting: Dermatology

## 2020-12-21 DIAGNOSIS — L578 Other skin changes due to chronic exposure to nonionizing radiation: Secondary | ICD-10-CM

## 2020-12-21 DIAGNOSIS — D485 Neoplasm of uncertain behavior of skin: Secondary | ICD-10-CM | POA: Diagnosis not present

## 2020-12-21 DIAGNOSIS — Z85828 Personal history of other malignant neoplasm of skin: Secondary | ICD-10-CM

## 2020-12-21 DIAGNOSIS — L570 Actinic keratosis: Secondary | ICD-10-CM

## 2020-12-21 NOTE — Progress Notes (Signed)
   Follow-Up Visit   Subjective  Robert Reynolds is a 87 y.o. male who presents for the following: Other (History of BCC of left scalp that still oozes sometimes. /Spots on scalp and nose that are irritating).  He has other areas to be evaluated today.  The following portions of the chart were reviewed this encounter and updated as appropriate:   Tobacco  Allergies  Meds  Problems  Med Hx  Surg Hx  Fam Hx     Review of Systems:  No other skin or systemic complaints except as noted in HPI or Assessment and Plan.  Objective  Well appearing patient in no apparent distress; mood and affect are within normal limits.  A focused examination was performed including scalp, face. Relevant physical exam findings are noted in the Assessment and Plan.  Left scalp Healing EDC site  Right Tip of Nose Pink papule  Scalp (5) Erythematous thin papules/macules with gritty scale.    Assessment & Plan   Actinic Damage - chronic, secondary to cumulative UV radiation exposure/sun exposure over time - diffuse scaly erythematous macules with underlying dyspigmentation - Recommend daily broad spectrum sunscreen SPF 30+ to sun-exposed areas, reapply every 2 hours as needed.  - Recommend staying in the shade or wearing long sleeves, sun glasses (UVA+UVB protection) and wide brim hats (4-inch brim around the entire circumference of the hat). - Call for new or changing lesions.  History of basal cell carcinoma (BCC) Left scalp Finacea foam bid  - sample given to help decrease inflammation of EDC site No evidence of recurrence at this time.  We will recheck on follow-up.  Neoplasm of uncertain behavior of skin -rule out BCC Right Tip of Nose Will plan shave removal and EDC on follow up  AK (actinic keratosis) (5) Scalp Destruction of lesion - Scalp Complexity: simple   Destruction method: cryotherapy   Informed consent: discussed and consent obtained   Timeout:  patient name, date of birth,  surgical site, and procedure verified Lesion destroyed using liquid nitrogen: Yes   Region frozen until ice ball extended beyond lesion: Yes   Outcome: patient tolerated procedure well with no complications   Post-procedure details: wound care instructions given    Return for Follow up as scheduled.  I, Ashok Cordia, CMA, am acting as scribe for Sarina Ser, MD . Documentation: I have reviewed the above documentation for accuracy and completeness, and I agree with the above.  Sarina Ser, MD

## 2020-12-21 NOTE — Patient Instructions (Signed)

## 2020-12-22 DIAGNOSIS — M5416 Radiculopathy, lumbar region: Secondary | ICD-10-CM | POA: Diagnosis not present

## 2020-12-22 DIAGNOSIS — M545 Low back pain, unspecified: Secondary | ICD-10-CM | POA: Diagnosis not present

## 2020-12-24 ENCOUNTER — Encounter: Payer: Self-pay | Admitting: Dermatology

## 2021-01-02 ENCOUNTER — Other Ambulatory Visit: Payer: Self-pay

## 2021-01-02 ENCOUNTER — Ambulatory Visit: Payer: Medicare HMO | Admitting: Dermatology

## 2021-01-02 DIAGNOSIS — I2510 Atherosclerotic heart disease of native coronary artery without angina pectoris: Secondary | ICD-10-CM | POA: Diagnosis not present

## 2021-01-02 DIAGNOSIS — I10 Essential (primary) hypertension: Secondary | ICD-10-CM | POA: Diagnosis not present

## 2021-01-02 DIAGNOSIS — D485 Neoplasm of uncertain behavior of skin: Secondary | ICD-10-CM

## 2021-01-02 DIAGNOSIS — L578 Other skin changes due to chronic exposure to nonionizing radiation: Secondary | ICD-10-CM

## 2021-01-02 DIAGNOSIS — C44311 Basal cell carcinoma of skin of nose: Secondary | ICD-10-CM | POA: Diagnosis not present

## 2021-01-02 DIAGNOSIS — G894 Chronic pain syndrome: Secondary | ICD-10-CM | POA: Diagnosis not present

## 2021-01-02 DIAGNOSIS — M5440 Lumbago with sciatica, unspecified side: Secondary | ICD-10-CM | POA: Diagnosis not present

## 2021-01-02 DIAGNOSIS — E782 Mixed hyperlipidemia: Secondary | ICD-10-CM | POA: Diagnosis not present

## 2021-01-02 NOTE — Progress Notes (Signed)
   Follow-Up Visit   Subjective  Robert Reynolds is a 87 y.o. male who presents for the following: irregular skin lesion (On the R nasal tip - shave removal and ED&C ).  Accompanied by daughter  The following portions of the chart were reviewed this encounter and updated as appropriate:   Tobacco  Allergies  Meds  Problems  Med Hx  Surg Hx  Fam Hx     Review of Systems:  No other skin or systemic complaints except as noted in HPI or Assessment and Plan.  Objective  Well appearing patient in no apparent distress; mood and affect are within normal limits.  A focused examination was performed including the face. Relevant physical exam findings are noted in the Assessment and Plan.   Assessment & Plan   Actinic Damage - chronic, secondary to cumulative UV radiation exposure/sun exposure over time - diffuse scaly erythematous macules with underlying dyspigmentation - Recommend daily broad spectrum sunscreen SPF 30+ to sun-exposed areas, reapply every 2 hours as needed.  - Recommend staying in the shade or wearing long sleeves, sun glasses (UVA+UVB protection) and wide brim hats (4-inch brim around the entire circumference of the hat). - Call for new or changing lesions.  Neoplasm of uncertain behavior of skin R nasal tip  Epidermal / dermal shaving  Lesion diameter (cm):  1 Informed consent: discussed and consent obtained   Timeout: patient name, date of birth, surgical site, and procedure verified   Procedure prep:  Patient was prepped and draped in usual sterile fashion Prep type:  Isopropyl alcohol Anesthesia: the lesion was anesthetized in a standard fashion   Anesthetic:  1% lidocaine w/ epinephrine 1-100,000 buffered w/ 8.4% NaHCO3 Instrument used: flexible razor blade   Hemostasis achieved with: pressure, aluminum chloride and electrodesiccation   Outcome: patient tolerated procedure well   Post-procedure details: sterile dressing applied and wound care  instructions given   Dressing type: bandage and petrolatum    Destruction of lesion Complexity: extensive   Destruction method: electrodesiccation and curettage   Informed consent: discussed and consent obtained   Timeout:  patient name, date of birth, surgical site, and procedure verified Procedure prep:  Patient was prepped and draped in usual sterile fashion Prep type:  Isopropyl alcohol Anesthesia: the lesion was anesthetized in a standard fashion   Anesthetic:  1% lidocaine w/ epinephrine 1-100,000 buffered w/ 8.4% NaHCO3 Curettage performed in three different directions: Yes   Electrodesiccation performed over the curetted area: Yes   Lesion length (cm):  1 Lesion width (cm):  1 Margin per side (cm):  0.2 Final wound size (cm):  1.4 Hemostasis achieved with:  pressure, aluminum chloride and electrodesiccation Outcome: patient tolerated procedure well with no complications   Post-procedure details: sterile dressing applied and wound care instructions given   Dressing type: bandage and petrolatum    Specimen 1 - Surgical pathology Differential Diagnosis: BCC vs other  Check Margins: No EDC today  Return for appointment as scheduled.  I, Ashok Cordia, CMA, am acting as scribe for Sarina Ser, MD . Documentation: I have reviewed the above documentation for accuracy and completeness, and I agree with the above.  Sarina Ser, MD

## 2021-01-02 NOTE — Patient Instructions (Signed)
If you have any questions or concerns for your doctor, please call our main line at [phone] and press option 4 to reach your doctor's medical assistant. If no one answers, please leave a voicemail as directed and we will return your call as soon as possible. Messages left after 4 pm will be answered the following business day.   You may also send [place] a message via [place]. We typically respond to MyChart messages within 1-2 business days.  For prescription refills, please ask your pharmacy to contact our office. Our fax number is [phone].  If you have an urgent issue when the clinic is closed that cannot wait until the next business day, you can page your doctor at the number below.    Please note that while we do our best to be available for urgent issues outside of office hours, we are not available 24/7.   If you have an urgent issue and are unable to reach [place], you may choose to seek medical care at your doctor's office, retail clinic, urgent care center, or emergency room.  If you have a medical emergency, please immediately call 911 or go to the emergency department.  Pager Numbers  - Dr. Nehemiah Massed: [phone]  - Dr. Laurence Ferrari: [phone]  - Dr. Nicole Kindred: [phone]  In the event of inclement weather, please call our main line at [phone] for an update on the status of any delays or closures.  Dermatology Medication Tips: Please keep the boxes that topical medications come in in order to help keep track of the instructions about where and how to use these. Pharmacies typically print the medication instructions only on the boxes and not directly on the medication tubes.   If your medication is too expensive, please contact our office at [phone] option 4 or send [place] a message through [place].   We are unable to tell what your co-pay for medications will be in advance as this is different depending on your insurance coverage. However, we may be able to find a substitute  medication at lower cost or fill out paperwork to get insurance to cover a needed medication.   If a prior authorization is required to get your medication covered by your insurance company, please allow [place] 1-2 business days to complete this process.  Drug prices often vary depending on where the prescription is filled and some pharmacies may offer cheaper prices.  The website www.goodrx.com contains coupons for medications through different pharmacies. The prices here do not account for what the cost may be with help from insurance (it may be cheaper with your insurance), but the website can give you the price if you did not use any insurance.  - You can print the associated coupon and take it with your prescription to the pharmacy.  - You may also stop by our office during regular business hours and pick up a GoodRx coupon card.  - If you need your prescription sent electronically to a different pharmacy, notify our office through [facility] or by phone at [phone] option 4. Electrodesiccation and Curettage ("Scrape and Burn") Wound Care Instructions  Leave the original bandage on for 24 hours if possible.  If the bandage becomes soaked or soiled before that time, it is OK to remove it and examine the wound.  A small amount of post-operative bleeding is normal.  If excessive bleeding occurs, remove the bandage, place gauze over the site and apply continuous pressure (no peeking) over the area for 30 minutes. If  this does not work, please call our clinic as soon as possible or page your doctor if it is after hours.   Once a day, cleanse the wound with soap and water. It is fine to shower. If a thick crust develops you may use a Q-tip dipped into dilute hydrogen peroxide (mix 1:1 with water) to dissolve it.  Hydrogen peroxide can slow the healing process, so use it only as needed.    After washing, apply petroleum jelly (Vaseline) or an antibiotic ointment if your doctor prescribed one  for you, followed by a bandage.    For best healing, the wound should be covered with a layer of ointment at all times. If you are not able to keep the area covered with a bandage to hold the ointment in place, this may mean re-applying the ointment several times a day.  Continue this wound care until the wound has healed and is no longer open. It may take several weeks for the wound to heal and close.  Itching and mild discomfort is normal during the healing process.  If you have any discomfort, you can take Tylenol (acetaminophen) or ibuprofen as directed on the bottle. (Please do not take these if you have an allergy to them or cannot take them for another reason).  Some redness, tenderness and white or yellow material in the wound is normal healing.  If the area becomes very sore and red, or develops a thick yellow-green material (pus), it may be infected; please notify us.    Wound healing continues for up to one year following surgery. It is not unusual to experience pain in the scar from time to time during the interval.  If the pain becomes severe or the scar thickens, you should notify the office.    A slight amount of redness in a scar is expected for the first six months.  After six months, the redness will fade and the scar will soften and fade.  The color difference becomes less noticeable with time.  If there are any problems, return for a post-op surgery check at your earliest convenience.  To improve the appearance of the scar, you can use silicone scar gel, cream, or sheets (such as Mederma or Serica) every night for up to one year. These are available over the counter (without a prescription).  Please call our office at [phone] for any questions or concerns.

## 2021-01-03 ENCOUNTER — Encounter: Payer: Self-pay | Admitting: Dermatology

## 2021-01-05 ENCOUNTER — Telehealth: Payer: Self-pay

## 2021-01-05 NOTE — Telephone Encounter (Signed)
-----   Message from Ralene Bathe, MD sent at 01/04/2021  7:31 PM EDT ----- Diagnosis Skin , right nasal tip BASAL CELL CARCINOMA, NODULAR AND INFILTRATIVE PATTERNS, BASE INVOLVED  Cancer - BCC Already treated Recheck next visit

## 2021-01-05 NOTE — Telephone Encounter (Signed)
Patient informed of pathology results 

## 2021-01-10 DIAGNOSIS — H353211 Exudative age-related macular degeneration, right eye, with active choroidal neovascularization: Secondary | ICD-10-CM | POA: Diagnosis not present

## 2021-01-17 DIAGNOSIS — M5416 Radiculopathy, lumbar region: Secondary | ICD-10-CM | POA: Diagnosis not present

## 2021-01-22 ENCOUNTER — Other Ambulatory Visit: Payer: Self-pay

## 2021-01-22 DIAGNOSIS — L989 Disorder of the skin and subcutaneous tissue, unspecified: Secondary | ICD-10-CM

## 2021-01-22 MED ORDER — MUPIROCIN 2 % EX OINT: 1.0000 "application " | TOPICAL_OINTMENT | Freq: Every day | CUTANEOUS | 0 refills | Status: AC

## 2021-02-02 DIAGNOSIS — M5416 Radiculopathy, lumbar region: Secondary | ICD-10-CM | POA: Diagnosis not present

## 2021-02-20 DIAGNOSIS — Z23 Encounter for immunization: Secondary | ICD-10-CM | POA: Diagnosis not present

## 2021-02-20 DIAGNOSIS — G609 Hereditary and idiopathic neuropathy, unspecified: Secondary | ICD-10-CM | POA: Diagnosis not present

## 2021-02-20 DIAGNOSIS — R2689 Other abnormalities of gait and mobility: Secondary | ICD-10-CM | POA: Diagnosis not present

## 2021-03-14 DIAGNOSIS — H353211 Exudative age-related macular degeneration, right eye, with active choroidal neovascularization: Secondary | ICD-10-CM | POA: Diagnosis not present

## 2021-03-19 ENCOUNTER — Ambulatory Visit: Payer: Medicare HMO | Admitting: Dermatology

## 2021-04-10 DIAGNOSIS — E7800 Pure hypercholesterolemia, unspecified: Secondary | ICD-10-CM | POA: Diagnosis not present

## 2021-04-10 DIAGNOSIS — I2510 Atherosclerotic heart disease of native coronary artery without angina pectoris: Secondary | ICD-10-CM | POA: Diagnosis not present

## 2021-04-10 DIAGNOSIS — I10 Essential (primary) hypertension: Secondary | ICD-10-CM | POA: Diagnosis not present

## 2021-04-10 DIAGNOSIS — Z79899 Other long term (current) drug therapy: Secondary | ICD-10-CM | POA: Diagnosis not present

## 2021-04-10 DIAGNOSIS — G609 Hereditary and idiopathic neuropathy, unspecified: Secondary | ICD-10-CM | POA: Diagnosis not present

## 2021-04-23 DIAGNOSIS — K3184 Gastroparesis: Secondary | ICD-10-CM | POA: Diagnosis not present

## 2021-04-23 DIAGNOSIS — K219 Gastro-esophageal reflux disease without esophagitis: Secondary | ICD-10-CM | POA: Diagnosis not present

## 2021-05-09 DIAGNOSIS — M5416 Radiculopathy, lumbar region: Secondary | ICD-10-CM | POA: Diagnosis not present

## 2021-05-16 DIAGNOSIS — H353211 Exudative age-related macular degeneration, right eye, with active choroidal neovascularization: Secondary | ICD-10-CM | POA: Diagnosis not present

## 2021-05-23 DIAGNOSIS — R4189 Other symptoms and signs involving cognitive functions and awareness: Secondary | ICD-10-CM | POA: Diagnosis not present

## 2021-05-23 DIAGNOSIS — R2689 Other abnormalities of gait and mobility: Secondary | ICD-10-CM | POA: Diagnosis not present

## 2021-05-23 DIAGNOSIS — G609 Hereditary and idiopathic neuropathy, unspecified: Secondary | ICD-10-CM | POA: Diagnosis not present

## 2021-06-19 ENCOUNTER — Ambulatory Visit: Payer: Medicare HMO | Admitting: Dermatology

## 2021-06-28 DIAGNOSIS — I2510 Atherosclerotic heart disease of native coronary artery without angina pectoris: Secondary | ICD-10-CM | POA: Diagnosis not present

## 2021-06-28 DIAGNOSIS — E785 Hyperlipidemia, unspecified: Secondary | ICD-10-CM | POA: Diagnosis not present

## 2021-06-28 DIAGNOSIS — E7800 Pure hypercholesterolemia, unspecified: Secondary | ICD-10-CM | POA: Diagnosis not present

## 2021-06-28 DIAGNOSIS — Z9989 Dependence on other enabling machines and devices: Secondary | ICD-10-CM | POA: Diagnosis not present

## 2021-06-28 DIAGNOSIS — G4733 Obstructive sleep apnea (adult) (pediatric): Secondary | ICD-10-CM | POA: Diagnosis not present

## 2021-06-28 DIAGNOSIS — I10 Essential (primary) hypertension: Secondary | ICD-10-CM | POA: Diagnosis not present

## 2021-06-28 DIAGNOSIS — I739 Peripheral vascular disease, unspecified: Secondary | ICD-10-CM | POA: Diagnosis not present

## 2021-07-18 DIAGNOSIS — H353211 Exudative age-related macular degeneration, right eye, with active choroidal neovascularization: Secondary | ICD-10-CM | POA: Diagnosis not present

## 2021-08-06 ENCOUNTER — Ambulatory Visit: Payer: Medicare HMO | Admitting: Dermatology

## 2021-08-06 DIAGNOSIS — L570 Actinic keratosis: Secondary | ICD-10-CM | POA: Diagnosis not present

## 2021-08-06 DIAGNOSIS — Z85828 Personal history of other malignant neoplasm of skin: Secondary | ICD-10-CM

## 2021-08-06 DIAGNOSIS — D229 Melanocytic nevi, unspecified: Secondary | ICD-10-CM

## 2021-08-06 DIAGNOSIS — Z1283 Encounter for screening for malignant neoplasm of skin: Secondary | ICD-10-CM | POA: Diagnosis not present

## 2021-08-06 DIAGNOSIS — L578 Other skin changes due to chronic exposure to nonionizing radiation: Secondary | ICD-10-CM

## 2021-08-06 DIAGNOSIS — L814 Other melanin hyperpigmentation: Secondary | ICD-10-CM

## 2021-08-06 DIAGNOSIS — L821 Other seborrheic keratosis: Secondary | ICD-10-CM

## 2021-08-06 DIAGNOSIS — L720 Epidermal cyst: Secondary | ICD-10-CM | POA: Diagnosis not present

## 2021-08-06 DIAGNOSIS — D1800 Hemangioma unspecified site: Secondary | ICD-10-CM | POA: Diagnosis not present

## 2021-08-06 NOTE — Patient Instructions (Addendum)

## 2021-08-06 NOTE — Progress Notes (Signed)
? ?Follow-Up Visit ?  ?Subjective  ?Robert Reynolds is a 88 y.o. male who presents for the following: Annual Exam (The patient presents for Upper Body Skin Exam (UBSE) for skin cancer screening and mole check.  The patient has spots, moles and lesions to be evaluated, some may be new or changing and the patient has concerns that these could be cancer. ). ? ?The following portions of the chart were reviewed this encounter and updated as appropriate:  ? Tobacco  Allergies  Meds  Problems  Med Hx  Surg Hx  Fam Hx   ?  ?Review of Systems:  No other skin or systemic complaints except as noted in HPI or Assessment and Plan. ? ?Objective  ?Well appearing patient in no apparent distress; mood and affect are within normal limits. ? ?An upper body skin examination was performed including scalp, head, eyes, ears, nose, lips, neck, chest, axillae, abdomen, back, bilateral upper extremities, hands, fingers, fingernails. All findings within normal limits unless otherwise noted below. ? ?Scalp x 2 ?Erythematous thin papules/macules with gritty scale.  ? ?right supraclavicular ?1.0 cm Subcutaneous nodule.  ? ? ?Assessment & Plan  ?AK (actinic keratosis) ?Scalp x 2 ? ?Actinic keratoses are precancerous spots that appear secondary to cumulative UV radiation exposure/sun exposure over time. They are chronic with expected duration over 1 year. A portion of actinic keratoses will progress to squamous cell carcinoma of the skin. It is not possible to reliably predict which spots will progress to skin cancer and so treatment is recommended to prevent development of skin cancer. ? ?Recommend daily broad spectrum sunscreen SPF 30+ to sun-exposed areas, reapply every 2 hours as needed.  ?Recommend staying in the shade or wearing long sleeves, sun glasses (UVA+UVB protection) and wide brim hats (4-inch brim around the entire circumference of the hat). ?Call for new or changing lesions.  ? ?Destruction of lesion - Scalp x  2 ?Complexity: simple   ?Destruction method: cryotherapy   ?Informed consent: discussed and consent obtained   ?Timeout:  patient name, date of birth, surgical site, and procedure verified ?Lesion destroyed using liquid nitrogen: Yes   ?Region frozen until ice ball extended beyond lesion: Yes   ?Outcome: patient tolerated procedure well with no complications   ?Post-procedure details: wound care instructions given   ? ?Epidermal inclusion cyst ?right supraclavicular ? ?Benign-appearing. Exam most consistent with an epidermal inclusion cyst. Discussed that a cyst is a benign growth that can grow over time and sometimes get irritated or inflamed. Recommend observation if it is not bothersome. Discussed option of surgical excision to remove it if it is growing, symptomatic, or other changes noted. Please call for new or changing lesions so they can be evaluated. ?  ? ?Lentigines ?- Scattered tan macules ?- Due to sun exposure ?- Benign-appearing, observe ?- Recommend daily broad spectrum sunscreen SPF 30+ to sun-exposed areas, reapply every 2 hours as needed. ?- Call for any changes ? ?Seborrheic Keratoses ?- Stuck-on, waxy, tan-brown papules and/or plaques  ?- Benign-appearing ?- Discussed benign etiology and prognosis. ?- Observe ?- Call for any changes ? ?Melanocytic Nevi ?- Tan-brown and/or pink-flesh-colored symmetric macules and papules ?- Benign appearing on exam today ?- Observation ?- Call clinic for new or changing moles ?- Recommend daily use of broad spectrum spf 30+ sunscreen to sun-exposed areas.  ? ?Hemangiomas ?- Red papules ?- Discussed benign nature ?- Observe ?- Call for any changes ? ?Actinic Damage ?- Chronic condition, secondary to cumulative UV/sun exposure ?-  diffuse scaly erythematous macules with underlying dyspigmentation ?- Recommend daily broad spectrum sunscreen SPF 30+ to sun-exposed areas, reapply every 2 hours as needed.  ?- Staying in the shade or wearing long sleeves, sun glasses  (UVA+UVB protection) and wide brim hats (4-inch brim around the entire circumference of the hat) are also recommended for sun protection.  ?- Call for new or changing lesions. ? ?History of Basal Cell Carcinoma of the Skin ?- No evidence of recurrence today ?- Recommend regular full body skin exams ?- Recommend daily broad spectrum sunscreen SPF 30+ to sun-exposed areas, reapply every 2 hours as needed.  ?- Call if any new or changing lesions are noted between office visits  ? ?Skin cancer screening performed today.  ? ?Return in about 6 months (around 02/05/2022) for TBSE, hx of BCC. ? ?I, Marye Round, CMA, am acting as scribe for Sarina Ser, MD .  ?Documentation: I have reviewed the above documentation for accuracy and completeness, and I agree with the above. ? ?Sarina Ser, MD ? ?

## 2021-08-07 ENCOUNTER — Encounter: Payer: Self-pay | Admitting: Dermatology

## 2021-08-08 DIAGNOSIS — M48062 Spinal stenosis, lumbar region with neurogenic claudication: Secondary | ICD-10-CM | POA: Diagnosis not present

## 2021-08-28 DIAGNOSIS — I10 Essential (primary) hypertension: Secondary | ICD-10-CM | POA: Diagnosis not present

## 2021-08-28 DIAGNOSIS — M549 Dorsalgia, unspecified: Secondary | ICD-10-CM | POA: Diagnosis not present

## 2021-08-28 DIAGNOSIS — I2510 Atherosclerotic heart disease of native coronary artery without angina pectoris: Secondary | ICD-10-CM | POA: Diagnosis not present

## 2021-08-28 DIAGNOSIS — E785 Hyperlipidemia, unspecified: Secondary | ICD-10-CM | POA: Diagnosis not present

## 2021-08-28 DIAGNOSIS — G8929 Other chronic pain: Secondary | ICD-10-CM | POA: Diagnosis not present

## 2021-08-28 DIAGNOSIS — Z79899 Other long term (current) drug therapy: Secondary | ICD-10-CM | POA: Diagnosis not present

## 2021-08-28 DIAGNOSIS — Z0000 Encounter for general adult medical examination without abnormal findings: Secondary | ICD-10-CM | POA: Diagnosis not present

## 2021-09-14 DIAGNOSIS — M48062 Spinal stenosis, lumbar region with neurogenic claudication: Secondary | ICD-10-CM | POA: Diagnosis not present

## 2021-09-19 DIAGNOSIS — H353211 Exudative age-related macular degeneration, right eye, with active choroidal neovascularization: Secondary | ICD-10-CM | POA: Diagnosis not present

## 2021-10-02 DIAGNOSIS — R2689 Other abnormalities of gait and mobility: Secondary | ICD-10-CM | POA: Diagnosis not present

## 2021-10-02 DIAGNOSIS — R4189 Other symptoms and signs involving cognitive functions and awareness: Secondary | ICD-10-CM | POA: Diagnosis not present

## 2021-10-02 DIAGNOSIS — M62838 Other muscle spasm: Secondary | ICD-10-CM | POA: Diagnosis not present

## 2021-10-02 DIAGNOSIS — G609 Hereditary and idiopathic neuropathy, unspecified: Secondary | ICD-10-CM | POA: Diagnosis not present

## 2021-10-10 DIAGNOSIS — M48062 Spinal stenosis, lumbar region with neurogenic claudication: Secondary | ICD-10-CM | POA: Diagnosis not present

## 2021-10-31 ENCOUNTER — Ambulatory Visit: Payer: Medicare HMO | Admitting: Dermatology

## 2021-10-31 DIAGNOSIS — M48062 Spinal stenosis, lumbar region with neurogenic claudication: Secondary | ICD-10-CM | POA: Diagnosis not present

## 2021-11-07 DIAGNOSIS — H353211 Exudative age-related macular degeneration, right eye, with active choroidal neovascularization: Secondary | ICD-10-CM | POA: Diagnosis not present

## 2021-12-11 ENCOUNTER — Ambulatory Visit: Payer: Medicare HMO | Admitting: Dermatology

## 2022-01-01 DIAGNOSIS — I10 Essential (primary) hypertension: Secondary | ICD-10-CM | POA: Diagnosis not present

## 2022-01-01 DIAGNOSIS — E785 Hyperlipidemia, unspecified: Secondary | ICD-10-CM | POA: Diagnosis not present

## 2022-01-01 DIAGNOSIS — E7800 Pure hypercholesterolemia, unspecified: Secondary | ICD-10-CM | POA: Diagnosis not present

## 2022-01-01 DIAGNOSIS — Z9989 Dependence on other enabling machines and devices: Secondary | ICD-10-CM | POA: Diagnosis not present

## 2022-01-01 DIAGNOSIS — I2510 Atherosclerotic heart disease of native coronary artery without angina pectoris: Secondary | ICD-10-CM | POA: Diagnosis not present

## 2022-01-01 DIAGNOSIS — G4733 Obstructive sleep apnea (adult) (pediatric): Secondary | ICD-10-CM | POA: Diagnosis not present

## 2022-01-01 DIAGNOSIS — R55 Syncope and collapse: Secondary | ICD-10-CM | POA: Diagnosis not present

## 2022-01-02 DIAGNOSIS — E785 Hyperlipidemia, unspecified: Secondary | ICD-10-CM | POA: Diagnosis not present

## 2022-01-02 DIAGNOSIS — Z79899 Other long term (current) drug therapy: Secondary | ICD-10-CM | POA: Diagnosis not present

## 2022-01-02 DIAGNOSIS — I2510 Atherosclerotic heart disease of native coronary artery without angina pectoris: Secondary | ICD-10-CM | POA: Diagnosis not present

## 2022-01-02 DIAGNOSIS — G894 Chronic pain syndrome: Secondary | ICD-10-CM | POA: Diagnosis not present

## 2022-01-02 DIAGNOSIS — H353211 Exudative age-related macular degeneration, right eye, with active choroidal neovascularization: Secondary | ICD-10-CM | POA: Diagnosis not present

## 2022-01-02 DIAGNOSIS — I10 Essential (primary) hypertension: Secondary | ICD-10-CM | POA: Diagnosis not present

## 2022-01-16 ENCOUNTER — Ambulatory Visit: Payer: Medicare HMO | Admitting: Dermatology

## 2022-02-27 DIAGNOSIS — H353211 Exudative age-related macular degeneration, right eye, with active choroidal neovascularization: Secondary | ICD-10-CM | POA: Diagnosis not present

## 2022-04-08 ENCOUNTER — Ambulatory Visit: Payer: Medicare HMO | Admitting: Dermatology

## 2022-04-10 DIAGNOSIS — Z79899 Other long term (current) drug therapy: Secondary | ICD-10-CM | POA: Diagnosis not present

## 2022-04-10 DIAGNOSIS — I2510 Atherosclerotic heart disease of native coronary artery without angina pectoris: Secondary | ICD-10-CM | POA: Diagnosis not present

## 2022-04-10 DIAGNOSIS — Z0000 Encounter for general adult medical examination without abnormal findings: Secondary | ICD-10-CM | POA: Diagnosis not present

## 2022-04-10 DIAGNOSIS — E7800 Pure hypercholesterolemia, unspecified: Secondary | ICD-10-CM | POA: Diagnosis not present

## 2022-04-10 DIAGNOSIS — I10 Essential (primary) hypertension: Secondary | ICD-10-CM | POA: Diagnosis not present

## 2022-04-10 DIAGNOSIS — G894 Chronic pain syndrome: Secondary | ICD-10-CM | POA: Diagnosis not present

## 2022-04-18 DIAGNOSIS — R4189 Other symptoms and signs involving cognitive functions and awareness: Secondary | ICD-10-CM | POA: Diagnosis not present

## 2022-04-18 DIAGNOSIS — R2689 Other abnormalities of gait and mobility: Secondary | ICD-10-CM | POA: Diagnosis not present

## 2022-04-18 DIAGNOSIS — G609 Hereditary and idiopathic neuropathy, unspecified: Secondary | ICD-10-CM | POA: Diagnosis not present

## 2022-05-01 DIAGNOSIS — H353211 Exudative age-related macular degeneration, right eye, with active choroidal neovascularization: Secondary | ICD-10-CM | POA: Diagnosis not present

## 2022-05-22 DIAGNOSIS — M48062 Spinal stenosis, lumbar region with neurogenic claudication: Secondary | ICD-10-CM | POA: Diagnosis not present

## 2022-06-26 DIAGNOSIS — E785 Hyperlipidemia, unspecified: Secondary | ICD-10-CM | POA: Diagnosis not present

## 2022-06-26 DIAGNOSIS — I2510 Atherosclerotic heart disease of native coronary artery without angina pectoris: Secondary | ICD-10-CM | POA: Diagnosis not present

## 2022-06-26 DIAGNOSIS — R011 Cardiac murmur, unspecified: Secondary | ICD-10-CM | POA: Diagnosis not present

## 2022-06-26 DIAGNOSIS — I10 Essential (primary) hypertension: Secondary | ICD-10-CM | POA: Diagnosis not present

## 2022-07-03 DIAGNOSIS — H353211 Exudative age-related macular degeneration, right eye, with active choroidal neovascularization: Secondary | ICD-10-CM | POA: Diagnosis not present

## 2022-07-10 DIAGNOSIS — I2510 Atherosclerotic heart disease of native coronary artery without angina pectoris: Secondary | ICD-10-CM | POA: Diagnosis not present

## 2022-08-06 DIAGNOSIS — K3184 Gastroparesis: Secondary | ICD-10-CM | POA: Diagnosis not present

## 2022-08-06 DIAGNOSIS — K219 Gastro-esophageal reflux disease without esophagitis: Secondary | ICD-10-CM | POA: Diagnosis not present

## 2022-08-14 DIAGNOSIS — I2510 Atherosclerotic heart disease of native coronary artery without angina pectoris: Secondary | ICD-10-CM | POA: Diagnosis not present

## 2022-08-14 DIAGNOSIS — E785 Hyperlipidemia, unspecified: Secondary | ICD-10-CM | POA: Diagnosis not present

## 2022-08-14 DIAGNOSIS — G8929 Other chronic pain: Secondary | ICD-10-CM | POA: Diagnosis not present

## 2022-08-14 DIAGNOSIS — Z79899 Other long term (current) drug therapy: Secondary | ICD-10-CM | POA: Diagnosis not present

## 2022-08-14 DIAGNOSIS — Z0000 Encounter for general adult medical examination without abnormal findings: Secondary | ICD-10-CM | POA: Diagnosis not present

## 2022-08-14 DIAGNOSIS — I10 Essential (primary) hypertension: Secondary | ICD-10-CM | POA: Diagnosis not present

## 2022-08-14 DIAGNOSIS — K219 Gastro-esophageal reflux disease without esophagitis: Secondary | ICD-10-CM | POA: Diagnosis not present

## 2022-09-11 DIAGNOSIS — H353211 Exudative age-related macular degeneration, right eye, with active choroidal neovascularization: Secondary | ICD-10-CM | POA: Diagnosis not present

## 2022-10-10 DIAGNOSIS — R609 Edema, unspecified: Secondary | ICD-10-CM | POA: Diagnosis not present

## 2022-10-10 DIAGNOSIS — E569 Vitamin deficiency, unspecified: Secondary | ICD-10-CM | POA: Diagnosis not present

## 2022-10-10 DIAGNOSIS — G609 Hereditary and idiopathic neuropathy, unspecified: Secondary | ICD-10-CM | POA: Diagnosis not present

## 2022-10-11 DIAGNOSIS — M5010 Cervical disc disorder with radiculopathy, unspecified cervical region: Secondary | ICD-10-CM | POA: Diagnosis not present

## 2022-10-11 DIAGNOSIS — M5136 Other intervertebral disc degeneration, lumbar region: Secondary | ICD-10-CM | POA: Diagnosis not present

## 2022-10-11 DIAGNOSIS — G609 Hereditary and idiopathic neuropathy, unspecified: Secondary | ICD-10-CM | POA: Diagnosis not present

## 2022-10-11 DIAGNOSIS — I739 Peripheral vascular disease, unspecified: Secondary | ICD-10-CM | POA: Diagnosis not present

## 2022-10-11 DIAGNOSIS — I129 Hypertensive chronic kidney disease with stage 1 through stage 4 chronic kidney disease, or unspecified chronic kidney disease: Secondary | ICD-10-CM | POA: Diagnosis not present

## 2022-10-11 DIAGNOSIS — E538 Deficiency of other specified B group vitamins: Secondary | ICD-10-CM | POA: Diagnosis not present

## 2022-10-11 DIAGNOSIS — N189 Chronic kidney disease, unspecified: Secondary | ICD-10-CM | POA: Diagnosis not present

## 2022-10-11 DIAGNOSIS — G44209 Tension-type headache, unspecified, not intractable: Secondary | ICD-10-CM | POA: Diagnosis not present

## 2022-10-11 DIAGNOSIS — G3184 Mild cognitive impairment, so stated: Secondary | ICD-10-CM | POA: Diagnosis not present

## 2022-10-15 DIAGNOSIS — M5136 Other intervertebral disc degeneration, lumbar region: Secondary | ICD-10-CM | POA: Diagnosis not present

## 2022-10-15 DIAGNOSIS — G44209 Tension-type headache, unspecified, not intractable: Secondary | ICD-10-CM | POA: Diagnosis not present

## 2022-10-15 DIAGNOSIS — I129 Hypertensive chronic kidney disease with stage 1 through stage 4 chronic kidney disease, or unspecified chronic kidney disease: Secondary | ICD-10-CM | POA: Diagnosis not present

## 2022-10-15 DIAGNOSIS — I739 Peripheral vascular disease, unspecified: Secondary | ICD-10-CM | POA: Diagnosis not present

## 2022-10-15 DIAGNOSIS — G609 Hereditary and idiopathic neuropathy, unspecified: Secondary | ICD-10-CM | POA: Diagnosis not present

## 2022-10-15 DIAGNOSIS — G3184 Mild cognitive impairment, so stated: Secondary | ICD-10-CM | POA: Diagnosis not present

## 2022-10-15 DIAGNOSIS — N189 Chronic kidney disease, unspecified: Secondary | ICD-10-CM | POA: Diagnosis not present

## 2022-10-15 DIAGNOSIS — M5010 Cervical disc disorder with radiculopathy, unspecified cervical region: Secondary | ICD-10-CM | POA: Diagnosis not present

## 2022-10-15 DIAGNOSIS — E538 Deficiency of other specified B group vitamins: Secondary | ICD-10-CM | POA: Diagnosis not present

## 2022-10-22 DIAGNOSIS — I739 Peripheral vascular disease, unspecified: Secondary | ICD-10-CM | POA: Diagnosis not present

## 2022-10-22 DIAGNOSIS — M5136 Other intervertebral disc degeneration, lumbar region: Secondary | ICD-10-CM | POA: Diagnosis not present

## 2022-10-22 DIAGNOSIS — N189 Chronic kidney disease, unspecified: Secondary | ICD-10-CM | POA: Diagnosis not present

## 2022-10-22 DIAGNOSIS — I129 Hypertensive chronic kidney disease with stage 1 through stage 4 chronic kidney disease, or unspecified chronic kidney disease: Secondary | ICD-10-CM | POA: Diagnosis not present

## 2022-10-22 DIAGNOSIS — G44209 Tension-type headache, unspecified, not intractable: Secondary | ICD-10-CM | POA: Diagnosis not present

## 2022-10-22 DIAGNOSIS — G3184 Mild cognitive impairment, so stated: Secondary | ICD-10-CM | POA: Diagnosis not present

## 2022-10-22 DIAGNOSIS — E538 Deficiency of other specified B group vitamins: Secondary | ICD-10-CM | POA: Diagnosis not present

## 2022-10-22 DIAGNOSIS — M5010 Cervical disc disorder with radiculopathy, unspecified cervical region: Secondary | ICD-10-CM | POA: Diagnosis not present

## 2022-10-22 DIAGNOSIS — G609 Hereditary and idiopathic neuropathy, unspecified: Secondary | ICD-10-CM | POA: Diagnosis not present

## 2022-10-23 DIAGNOSIS — R609 Edema, unspecified: Secondary | ICD-10-CM | POA: Diagnosis not present

## 2022-10-23 DIAGNOSIS — R600 Localized edema: Secondary | ICD-10-CM | POA: Diagnosis not present

## 2022-10-29 DIAGNOSIS — M5010 Cervical disc disorder with radiculopathy, unspecified cervical region: Secondary | ICD-10-CM | POA: Diagnosis not present

## 2022-10-29 DIAGNOSIS — G609 Hereditary and idiopathic neuropathy, unspecified: Secondary | ICD-10-CM | POA: Diagnosis not present

## 2022-10-29 DIAGNOSIS — M5136 Other intervertebral disc degeneration, lumbar region: Secondary | ICD-10-CM | POA: Diagnosis not present

## 2022-10-29 DIAGNOSIS — N189 Chronic kidney disease, unspecified: Secondary | ICD-10-CM | POA: Diagnosis not present

## 2022-10-29 DIAGNOSIS — I739 Peripheral vascular disease, unspecified: Secondary | ICD-10-CM | POA: Diagnosis not present

## 2022-10-29 DIAGNOSIS — I129 Hypertensive chronic kidney disease with stage 1 through stage 4 chronic kidney disease, or unspecified chronic kidney disease: Secondary | ICD-10-CM | POA: Diagnosis not present

## 2022-10-29 DIAGNOSIS — G3184 Mild cognitive impairment, so stated: Secondary | ICD-10-CM | POA: Diagnosis not present

## 2022-10-29 DIAGNOSIS — G44209 Tension-type headache, unspecified, not intractable: Secondary | ICD-10-CM | POA: Diagnosis not present

## 2022-10-29 DIAGNOSIS — E538 Deficiency of other specified B group vitamins: Secondary | ICD-10-CM | POA: Diagnosis not present

## 2022-11-06 DIAGNOSIS — E538 Deficiency of other specified B group vitamins: Secondary | ICD-10-CM | POA: Diagnosis not present

## 2022-11-06 DIAGNOSIS — I129 Hypertensive chronic kidney disease with stage 1 through stage 4 chronic kidney disease, or unspecified chronic kidney disease: Secondary | ICD-10-CM | POA: Diagnosis not present

## 2022-11-06 DIAGNOSIS — I739 Peripheral vascular disease, unspecified: Secondary | ICD-10-CM | POA: Diagnosis not present

## 2022-11-06 DIAGNOSIS — G3184 Mild cognitive impairment, so stated: Secondary | ICD-10-CM | POA: Diagnosis not present

## 2022-11-06 DIAGNOSIS — M5010 Cervical disc disorder with radiculopathy, unspecified cervical region: Secondary | ICD-10-CM | POA: Diagnosis not present

## 2022-11-06 DIAGNOSIS — N189 Chronic kidney disease, unspecified: Secondary | ICD-10-CM | POA: Diagnosis not present

## 2022-11-06 DIAGNOSIS — G44209 Tension-type headache, unspecified, not intractable: Secondary | ICD-10-CM | POA: Diagnosis not present

## 2022-11-06 DIAGNOSIS — M5136 Other intervertebral disc degeneration, lumbar region: Secondary | ICD-10-CM | POA: Diagnosis not present

## 2022-11-06 DIAGNOSIS — G609 Hereditary and idiopathic neuropathy, unspecified: Secondary | ICD-10-CM | POA: Diagnosis not present

## 2022-11-13 DIAGNOSIS — G44209 Tension-type headache, unspecified, not intractable: Secondary | ICD-10-CM | POA: Diagnosis not present

## 2022-11-13 DIAGNOSIS — N189 Chronic kidney disease, unspecified: Secondary | ICD-10-CM | POA: Diagnosis not present

## 2022-11-13 DIAGNOSIS — I129 Hypertensive chronic kidney disease with stage 1 through stage 4 chronic kidney disease, or unspecified chronic kidney disease: Secondary | ICD-10-CM | POA: Diagnosis not present

## 2022-11-13 DIAGNOSIS — M5010 Cervical disc disorder with radiculopathy, unspecified cervical region: Secondary | ICD-10-CM | POA: Diagnosis not present

## 2022-11-13 DIAGNOSIS — I739 Peripheral vascular disease, unspecified: Secondary | ICD-10-CM | POA: Diagnosis not present

## 2022-11-13 DIAGNOSIS — G609 Hereditary and idiopathic neuropathy, unspecified: Secondary | ICD-10-CM | POA: Diagnosis not present

## 2022-11-13 DIAGNOSIS — M5136 Other intervertebral disc degeneration, lumbar region: Secondary | ICD-10-CM | POA: Diagnosis not present

## 2022-11-13 DIAGNOSIS — G3184 Mild cognitive impairment, so stated: Secondary | ICD-10-CM | POA: Diagnosis not present

## 2022-11-13 DIAGNOSIS — E538 Deficiency of other specified B group vitamins: Secondary | ICD-10-CM | POA: Diagnosis not present

## 2022-11-19 DIAGNOSIS — I129 Hypertensive chronic kidney disease with stage 1 through stage 4 chronic kidney disease, or unspecified chronic kidney disease: Secondary | ICD-10-CM | POA: Diagnosis not present

## 2022-11-19 DIAGNOSIS — G609 Hereditary and idiopathic neuropathy, unspecified: Secondary | ICD-10-CM | POA: Diagnosis not present

## 2022-11-19 DIAGNOSIS — I739 Peripheral vascular disease, unspecified: Secondary | ICD-10-CM | POA: Diagnosis not present

## 2022-11-19 DIAGNOSIS — N189 Chronic kidney disease, unspecified: Secondary | ICD-10-CM | POA: Diagnosis not present

## 2022-11-19 DIAGNOSIS — G44209 Tension-type headache, unspecified, not intractable: Secondary | ICD-10-CM | POA: Diagnosis not present

## 2022-11-19 DIAGNOSIS — M5136 Other intervertebral disc degeneration, lumbar region: Secondary | ICD-10-CM | POA: Diagnosis not present

## 2022-11-19 DIAGNOSIS — M5010 Cervical disc disorder with radiculopathy, unspecified cervical region: Secondary | ICD-10-CM | POA: Diagnosis not present

## 2022-11-19 DIAGNOSIS — G3184 Mild cognitive impairment, so stated: Secondary | ICD-10-CM | POA: Diagnosis not present

## 2022-11-19 DIAGNOSIS — E538 Deficiency of other specified B group vitamins: Secondary | ICD-10-CM | POA: Diagnosis not present

## 2022-12-04 DIAGNOSIS — H353211 Exudative age-related macular degeneration, right eye, with active choroidal neovascularization: Secondary | ICD-10-CM | POA: Diagnosis not present

## 2022-12-18 DIAGNOSIS — E7800 Pure hypercholesterolemia, unspecified: Secondary | ICD-10-CM | POA: Diagnosis not present

## 2022-12-18 DIAGNOSIS — I10 Essential (primary) hypertension: Secondary | ICD-10-CM | POA: Diagnosis not present

## 2022-12-18 DIAGNOSIS — I2510 Atherosclerotic heart disease of native coronary artery without angina pectoris: Secondary | ICD-10-CM | POA: Diagnosis not present

## 2022-12-18 DIAGNOSIS — G894 Chronic pain syndrome: Secondary | ICD-10-CM | POA: Diagnosis not present

## 2022-12-18 DIAGNOSIS — Z79899 Other long term (current) drug therapy: Secondary | ICD-10-CM | POA: Diagnosis not present

## 2022-12-25 DIAGNOSIS — G4733 Obstructive sleep apnea (adult) (pediatric): Secondary | ICD-10-CM | POA: Diagnosis not present

## 2022-12-25 DIAGNOSIS — E7800 Pure hypercholesterolemia, unspecified: Secondary | ICD-10-CM | POA: Diagnosis not present

## 2022-12-25 DIAGNOSIS — I2510 Atherosclerotic heart disease of native coronary artery without angina pectoris: Secondary | ICD-10-CM | POA: Diagnosis not present

## 2022-12-25 DIAGNOSIS — I10 Essential (primary) hypertension: Secondary | ICD-10-CM | POA: Diagnosis not present

## 2023-01-08 ENCOUNTER — Ambulatory Visit: Payer: Medicare HMO | Admitting: Dermatology

## 2023-01-08 ENCOUNTER — Encounter: Payer: Self-pay | Admitting: Dermatology

## 2023-01-08 VITALS — BP 166/79 | HR 51

## 2023-01-08 DIAGNOSIS — W908XXA Exposure to other nonionizing radiation, initial encounter: Secondary | ICD-10-CM | POA: Diagnosis not present

## 2023-01-08 DIAGNOSIS — L570 Actinic keratosis: Secondary | ICD-10-CM

## 2023-01-08 DIAGNOSIS — Z85828 Personal history of other malignant neoplasm of skin: Secondary | ICD-10-CM | POA: Diagnosis not present

## 2023-01-08 DIAGNOSIS — Z872 Personal history of diseases of the skin and subcutaneous tissue: Secondary | ICD-10-CM

## 2023-01-08 DIAGNOSIS — C44311 Basal cell carcinoma of skin of nose: Secondary | ICD-10-CM | POA: Diagnosis not present

## 2023-01-08 DIAGNOSIS — D492 Neoplasm of unspecified behavior of bone, soft tissue, and skin: Secondary | ICD-10-CM

## 2023-01-08 DIAGNOSIS — C4491 Basal cell carcinoma of skin, unspecified: Secondary | ICD-10-CM

## 2023-01-08 HISTORY — DX: Basal cell carcinoma of skin, unspecified: C44.91

## 2023-01-08 NOTE — Patient Instructions (Signed)
Cryotherapy Aftercare  Wash gently with soap and water everyday.   Apply Vaseline Jelly until healed.    Wound Care Instructions  Cleanse wound gently with soap and water once a day then pat dry with clean gauze. Apply a thin coat of Petrolatum (petroleum jelly, "Vaseline") over the wound (unless you have an allergy to this). We recommend that you use a new, sterile tube of Vaseline. Do not pick or remove scabs. Do not remove the yellow or white "healing tissue" from the base of the wound.  Cover the wound with fresh, clean, nonstick gauze and secure with paper tape. You may use Band-Aids in place of gauze and tape if the wound is small enough, but would recommend trimming much of the tape off as there is often too much. Sometimes Band-Aids can irritate the skin.  You should call the office for your biopsy report after 1 week if you have not already been contacted.  If you experience any problems, such as abnormal amounts of bleeding, swelling, significant bruising, significant pain, or evidence of infection, please call the office immediately.  FOR ADULT SURGERY PATIENTS: If you need something for pain relief you may take 1 extra strength Tylenol (acetaminophen) AND 2 Ibuprofen (200mg  each) together every 4 hours as needed for pain. (do not take these if you are allergic to them or if you have a reason you should not take them.) Typically, you may only need pain medication for 1 to 3 days.      Recommend daily broad spectrum sunscreen SPF 30+ to sun-exposed areas, reapply every 2 hours as needed. Call for new or changing lesions.  Staying in the shade or wearing long sleeves, sun glasses (UVA+UVB protection) and wide brim hats (4-inch brim around the entire circumference of the hat) are also recommended for sun protection.    Due to recent changes in healthcare laws, you may see results of your pathology and/or laboratory studies on MyChart before the doctors have had a chance to review  them. We understand that in some cases there may be results that are confusing or concerning to you. Please understand that not all results are received at the same time and often the doctors may need to interpret multiple results in order to provide you with the best plan of care or course of treatment. Therefore, we ask that you please give [place] 2 business days to thoroughly review all your results before contacting the office for clarification. Should we see a critical lab result, you will be contacted sooner.   If You Need Anything After Your Visit  If you have any questions or concerns for your doctor, please call our main line at [phone] and press option 4 to reach your doctor's medical assistant. If no one answers, please leave a voicemail as directed and we will return your call as soon as possible. Messages left after 4 pm will be answered the following business day.   You may also send [place] a message via MyChart. We typically respond to MyChart messages within 1-2 business days.  For prescription refills, please ask your pharmacy to contact our office. Our fax number is [phone].  If you have an urgent issue when the clinic is closed that cannot wait until the next business day, you can page your doctor at the number below.    Please note that while we do our best to be available for urgent issues outside of office hours, we are not available 24/7.   If  you have an urgent issue and are unable to reach [place], you may choose to seek medical care at your doctor's office, retail clinic, urgent care center, or emergency room.  If you have a medical emergency, please immediately call 911 or go to the emergency department.  Pager Numbers  - Dr. Gwen Pounds: [phone]  - Dr. Roseanne Reno: [phone]  - Dr. Katrinka Blazing: [phone]   In the event of inclement weather, please call our main line at [phone] for an update on the status of any delays or closures.  Dermatology Medication  Tips: Please keep the boxes that topical medications come in in order to help keep track of the instructions about where and how to use these. Pharmacies typically print the medication instructions only on the boxes and not directly on the medication tubes.   If your medication is too expensive, please contact our office at [phone] option 4 or send [place] a message through MyChart.   We are unable to tell what your co-pay for medications will be in advance as this is different depending on your insurance coverage. However, we may be able to find a substitute medication at lower cost or fill out paperwork to get insurance to cover a needed medication.   If a prior authorization is required to get your medication covered by your insurance company, please allow [place] 1-2 business days to complete this process.  Drug prices often vary depending on where the prescription is filled and some pharmacies may offer cheaper prices.  The website www.goodrx.com contains coupons for medications through different pharmacies. The prices here do not account for what the cost may be with help from insurance (it may be cheaper with your insurance), but the website can give you the price if you did not use any insurance.  - You can print the associated coupon and take it with your prescription to the pharmacy.  - You may also stop by our office during regular business hours and pick up a GoodRx coupon card.  - If you need your prescription sent electronically to a different pharmacy, notify our office through [facility] or by phone at [phone] option 4.     Si Usted Necesita Algo Despus de Su Visita  Tambin puede enviarnos un mensaje a travs de Clinical cytogeneticist. Por lo general respondemos a los mensajes de MyChart en el transcurso de 1 a 2 das hbiles.  Para renovar recetas, por favor pida a su farmacia que se ponga en contacto con nuestra oficina. Annie Sable de fax es [place] [phone].  Si tiene un  asunto urgente cuando la clnica est cerrada y que no puede esperar hasta el siguiente da hbil, puede llamar/localizar a su doctor(a) al nmero que aparece a continuacin.   Por favor, tenga en cuenta que aunque hacemos todo lo posible para estar disponibles para asuntos urgentes fuera del horario de [place], no estamos disponibles las 24 horas del da, los 7 [address]  [address] de la [place].   Si tiene un problema urgente y no puede comunicarse con nosotros, puede optar por buscar atencin mdica  en el consultorio de su doctor(a), en una clnica privada, en un centro de atencin urgente o en una sala de emergencias.  Si tiene Engineer, drilling, por favor llame inmediatamente al 911 o vaya a la sala de emergencias.  Nmeros de bper  - Dr. Gwen Pounds: [phone]  - Dra. Roseanne Reno: [phone]  - Dr. Katrinka Blazing: [phone]   En caso de inclemencias del tiempo, por favor llame a nuestra lnea  principal al [phone] para una actualizacin sobre el [place] de cualquier retraso o cierre.  Consejos para la medicacin en dermatologa: Por favor, guarde las cajas en las que vienen los medicamentos de uso tpico para ayudarle a seguir las instrucciones sobre dnde y cmo usarlos. Las farmacias generalmente imprimen las instrucciones del medicamento slo en las cajas y no directamente en los tubos del [place].   Si su medicamento es muy caro, por favor, pngase en contacto con Rolm [place]ala llamando al [phone] y presione la opcin 4 o envenos un mensaje a travs de Clinical cytogeneticist.   No podemos decirle cul ser su copago por los medicamentos por adelantado ya que esto es diferente dependiendo de la cobertura de su seguro. Sin embargo, es posible que podamos encontrar un medicamento sustituto a Audiological scientist un formulario para que el seguro cubra el medicamento que se considera necesario.   Si se requiere una autorizacin previa para que su compaa de seguros [place] su medicamento, por favor  permtanos de 1 a 2 das hbiles para completar [address]  Los precios de los medicamentos varan con frecuencia dependiendo del Environmental consultant de dnde se surte la receta y alguna farmacias pueden ofrecer precios ms baratos.  El sitio web www.goodrx.com tiene cupones para medicamentos de Health and safety inspector. Los precios aqu no tienen en cuenta lo que podra costar con la ayuda del seguro (puede ser ms barato con su seguro), pero el sitio web puede darle el precio si no utiliz Tourist information centre manager.  - Puede imprimir el cupn correspondiente y llevarlo con su receta a la farmacia.  - Tambin puede pasar por nuestra oficina durante el horario de atencin regular y Education officer, museum una tarjeta de cupones de GoodRx.  - Si necesita que su receta se enve electrnicamente a una farmacia diferente, informe a nuestra oficina a travs de MyChart de [place] o por telfono llamando al [phone] y presione la opcin 4.

## 2023-01-08 NOTE — Progress Notes (Signed)
Follow-Up Visit   Subjective  Robert Reynolds is a 89 y.o. male who presents for the following: Spots on scalp and nose. Lesion on nose bleeds at times. Comes and goes. Scaly areas on scalp. Hx of BCCs on scalp. Hx of AKs  The patient has spots, moles and lesions to be evaluated, some may be new or changing and the patient may have concern these could be cancer.  Daughter, Amy, is with patient and contributes to history.    The following portions of the chart were reviewed this encounter and updated as appropriate: medications, allergies, medical history  Review of Systems:  No other skin or systemic complaints except as noted in HPI or Assessment and Plan.  Objective  Well appearing patient in no apparent distress; mood and affect are within normal limits.  A focused examination was performed of the following areas: Scalp, face  Relevant physical exam findings are noted in the Assessment and Plan.  Right nasal dorsum 3 mm eroded pink papule with telangiectasias       Left superior occiptal scalp 8 mm pigmented macule with perifollicular accentuation        Scalp x6 (6) Erythematous thin papules/macules with gritty scale.     Assessment & Plan   Neoplasm of skin (2) Right nasal dorsum  Skin / nail biopsy Type of biopsy: tangential   Informed consent: discussed and consent obtained   Anesthesia: the lesion was anesthetized in a standard fashion   Anesthesia comment:  Area prepped with alcohol Anesthetic:  1% lidocaine w/ epinephrine 1-100,000 buffered w/ 8.4% NaHCO3 Instrument used: flexible razor blade   Hemostasis achieved with: pressure, aluminum chloride and electrodesiccation   Outcome: patient tolerated procedure well   Post-procedure details: wound care instructions given   Post-procedure details comment:  Ointment and small bandage applied  Specimen 1 - Surgical pathology Differential Diagnosis: R/O BCC  Check Margins: No  Left superior  occiptal scalp  Skin / nail biopsy Type of biopsy: tangential   Informed consent: discussed and consent obtained   Anesthesia: the lesion was anesthetized in a standard fashion   Anesthesia comment:  Area prepped with alcohol Anesthetic:  1% lidocaine w/ epinephrine 1-100,000 buffered w/ 8.4% NaHCO3 Instrument used: flexible razor blade   Hemostasis achieved with: pressure, aluminum chloride and electrodesiccation   Outcome: patient tolerated procedure well   Post-procedure details: wound care instructions given   Post-procedure details comment:  Ointment and small bandage applied  Specimen 2 - Surgical pathology Differential Diagnosis: R/O Lentigo vs Lentigo maligna  Check Margins: No  Recommend Mohs surgery for nose if +skin cancer. Discussed with patient and daughter at visit. Prefers [facility] in [place].   AK (actinic keratosis) (6) Scalp x6  Actinic keratoses are precancerous spots that appear secondary to cumulative UV radiation exposure/sun exposure over time. They are chronic with expected duration over 1 year. A portion of actinic keratoses will progress to squamous cell carcinoma of the skin. It is not possible to reliably predict which spots will progress to skin cancer and so treatment is recommended to prevent development of skin cancer.  Recommend daily broad spectrum sunscreen SPF 30+ to sun-exposed areas, reapply every 2 hours as needed.  Recommend staying in the shade or wearing long sleeves, sun glasses (UVA+UVB protection) and wide brim hats (4-inch brim around the entire circumference of the hat). Call for new or changing lesions.  Destruction of lesion - Scalp x6 (6)  Destruction method: cryotherapy   Informed consent: discussed  and consent obtained   Lesion destroyed using liquid nitrogen: Yes   Region frozen until ice ball extended beyond lesion: Yes   Outcome: patient tolerated procedure well with no complications   Post-procedure details: wound care  instructions given   Additional details:  Prior to procedure, discussed risks of blister formation, small wound, skin dyspigmentation, or rare scar following cryotherapy. Recommend Vaseline ointment to treated areas while healing.      Return for TBSE, Next Available.  I, Lawson Radar, CMA, am acting as scribe for Elie Goody, MD.   Documentation: I have reviewed the above documentation for accuracy and completeness, and I agree with the above.  Elie Goody, MD

## 2023-01-14 ENCOUNTER — Telehealth: Payer: Self-pay

## 2023-01-14 DIAGNOSIS — C44311 Basal cell carcinoma of skin of nose: Secondary | ICD-10-CM

## 2023-01-14 NOTE — Telephone Encounter (Signed)
-----   Message from [facility] sent at 01/14/2023  3:47 PM EDT ----- Diagnosis: 1. Skin , right nasal dorsum BASAL CELL CARCINOMA, NODULAR PATTERN 2. Skin , left superior occipital scalp ACTINIC KERATOSIS, PIGMENTED  1. NOSE Please call with diagnosis and determine where the patient would like to have Mohs surgery. If the patient asks for my recommendation for Mohs surgeon, please refer to [facility] (Dr Coralie Carpen and Dr Caprice [place])  Explanation: your biopsy shows a basal cell skin cancer in the second layer of the skin. This is the most common kind of skin cancer and is caused by damage from sun exposure. Basal cell skin cancers almost never spread beyond the skin, so they are not dangerous to your overall health. However, they will continue to grow, can bleed, cause nonhealing wounds, and disrupt nearby structures unless fully treated.  Treatment: Given the location and type of skin cancer, I recommend Mohs surgery. Mohs surgery involves cutting out the skin cancer and then checking under the microscope to ensure the whole skin cancer was removed. If any skin cancer remains, the surgeon will cut out more until it is fully removed. The cure rate is about 98-99%. Once the Mohs surgeon confirms the skin cancer is out, they will discuss the options to repair or heal the area. You must take it easy for about two weeks after surgery (no lifting over 10-15 lbs, avoid activity to get your heart rate and blood pressure up). It is done at another office outside of [place] ([place], [place], or [place]).  2. L scalp  Biopsy shows precancer. No further treatment needed. If anything regrows, patient should return.

## 2023-01-14 NOTE — Telephone Encounter (Signed)
(  Pt. Prefers [facility] for [facility].) Called patient. N/A. LMOM for him to return my call to discuss pathology results and treatment.

## 2023-01-16 NOTE — Addendum Note (Signed)
Addended by: Dorathy Daft R on: 01/16/2023 09:43 AM   Modules accepted: Orders

## 2023-01-16 NOTE — Telephone Encounter (Signed)
Spoke with patient and he asked me to contact his daughter Amy at [phone]. Advised Amy of BX results. Referral sent to [facility].

## 2023-02-19 DIAGNOSIS — C44311 Basal cell carcinoma of skin of nose: Secondary | ICD-10-CM | POA: Diagnosis not present

## 2023-02-26 DIAGNOSIS — H353211 Exudative age-related macular degeneration, right eye, with active choroidal neovascularization: Secondary | ICD-10-CM | POA: Diagnosis not present

## 2023-03-05 ENCOUNTER — Encounter: Payer: Self-pay | Admitting: Dermatology

## 2023-03-05 ENCOUNTER — Ambulatory Visit: Payer: Medicare HMO | Admitting: Dermatology

## 2023-03-05 DIAGNOSIS — C4441 Basal cell carcinoma of skin of scalp and neck: Secondary | ICD-10-CM | POA: Diagnosis not present

## 2023-03-05 DIAGNOSIS — W908XXA Exposure to other nonionizing radiation, initial encounter: Secondary | ICD-10-CM

## 2023-03-05 DIAGNOSIS — D1801 Hemangioma of skin and subcutaneous tissue: Secondary | ICD-10-CM | POA: Diagnosis not present

## 2023-03-05 DIAGNOSIS — L570 Actinic keratosis: Secondary | ICD-10-CM | POA: Diagnosis not present

## 2023-03-05 DIAGNOSIS — D489 Neoplasm of uncertain behavior, unspecified: Secondary | ICD-10-CM

## 2023-03-05 DIAGNOSIS — D492 Neoplasm of unspecified behavior of bone, soft tissue, and skin: Secondary | ICD-10-CM

## 2023-03-05 DIAGNOSIS — L814 Other melanin hyperpigmentation: Secondary | ICD-10-CM

## 2023-03-05 DIAGNOSIS — D229 Melanocytic nevi, unspecified: Secondary | ICD-10-CM

## 2023-03-05 DIAGNOSIS — D692 Other nonthrombocytopenic purpura: Secondary | ICD-10-CM

## 2023-03-05 DIAGNOSIS — Z85828 Personal history of other malignant neoplasm of skin: Secondary | ICD-10-CM

## 2023-03-05 DIAGNOSIS — L578 Other skin changes due to chronic exposure to nonionizing radiation: Secondary | ICD-10-CM

## 2023-03-05 DIAGNOSIS — Z1283 Encounter for screening for malignant neoplasm of skin: Secondary | ICD-10-CM | POA: Diagnosis not present

## 2023-03-05 DIAGNOSIS — L821 Other seborrheic keratosis: Secondary | ICD-10-CM

## 2023-03-05 NOTE — Progress Notes (Signed)
Follow-Up Visit   Subjective  Robert Reynolds is a 89 y.o. male who presents for the following: Skin Cancer Screening and Full Body Skin Exam  The patient presents for Total-Body Skin Exam (TBSE) for skin cancer screening and mole check. The patient has spots, moles and lesions to be evaluated, some may be new or changing and the patient may have concern these could be cancer.  Hx of BCC. Patient had mohs surgery two weeks ago on R Nasal Dorsum. No complaints or complications. C/o scaly spots on scalp.   The following portions of the chart were reviewed this encounter and updated as appropriate: medications, allergies, medical history  Review of Systems:  No other skin or systemic complaints except as noted in HPI or Assessment and Plan.  Objective  Well appearing patient in no apparent distress; mood and affect are within normal limits.  A full examination was performed including scalp, head, eyes, ears, nose, lips, neck, chest, axillae, abdomen, back, buttocks, bilateral upper extremities, bilateral lower extremities, hands, feet, fingers, toes, fingernails, and toenails. All findings within normal limits unless otherwise noted below.   Relevant physical exam findings are noted in the Assessment and Plan.  Right Frontal Scalp 7mm Pearly pink papule   Left Buccal Cheek (2), Left Hand - Posterior, Right Forearm - Posterior, Scalp (8) Erythematous thin papules/macules with gritty scale.     Assessment & Plan   SKIN CANCER SCREENING PERFORMED TODAY.  ACTINIC DAMAGE - Chronic condition, secondary to cumulative UV/sun exposure - diffuse scaly erythematous macules with underlying dyspigmentation - Recommend daily broad spectrum sunscreen SPF 30+ to sun-exposed areas, reapply every 2 hours as needed.  - Staying in the shade or wearing long sleeves, sun glasses (UVA+UVB protection) and wide brim hats (4-inch brim around the entire circumference of the hat) are also recommended for  sun protection.  - Call for new or changing lesions.  LENTIGINES, SEBORRHEIC KERATOSES, HEMANGIOMAS - Benign normal skin lesions - Benign-appearing - Call for any changes  MELANOCYTIC NEVI - Tan-brown and/or pink-flesh-colored symmetric macules and papules - Benign appearing on exam today - Observation - Call clinic for new or changing moles - Recommend daily use of broad spectrum spf 30+ sunscreen to sun-exposed areas.       Neoplasm of uncertain behavior Right Frontal Scalp  Skin / nail biopsy Type of biopsy: tangential   Informed consent: discussed and consent obtained   Timeout: patient name, date of birth, surgical site, and procedure verified   Procedure prep:  Patient was prepped and draped in usual sterile fashion Prep type:  Isopropyl alcohol Anesthesia: the lesion was anesthetized in a standard fashion   Anesthetic:  1% lidocaine w/ epinephrine 1-100,000 buffered w/ 8.4% NaHCO3 Instrument used: DermaBlade   Hemostasis achieved with: aluminum chloride   Outcome: patient tolerated procedure well   Post-procedure details: sterile dressing applied and wound care instructions given   Dressing type: petrolatum gauze and bandage    Specimen 1 - Surgical pathology Differential Diagnosis: R/o BCC  Check Margins: No  AK (actinic keratosis) (12) Left Hand - Posterior; Right Forearm - Posterior; Left Buccal Cheek (2); Scalp (8)  Destruction of lesion - Left Buccal Cheek (2), Left Hand - Posterior, Right Forearm - Posterior, Scalp (8) Complexity: simple   Destruction method: cryotherapy   Informed consent: discussed and consent obtained   Timeout:  patient name, date of birth, surgical site, and procedure verified Lesion destroyed using liquid nitrogen: Yes   Region frozen until ice  ball extended beyond lesion: Yes   Cryo cycles: 1 or 2. Outcome: patient tolerated procedure well with no complications   Post-procedure details: wound care instructions given     Lentigines  Multiple benign nevi  Seborrheic keratoses  Actinic elastosis  Cherry angioma   Return in about 1 year (around 03/04/2024) for tbse.  I, Germaine Pomfret, CMA, am acting as scribe for Elie Goody, MD.   Documentation: I have reviewed the above documentation for accuracy and completeness, and I agree with the above.  Elie Goody, MD

## 2023-03-05 NOTE — Patient Instructions (Signed)
Wound Care Instructions  Cleanse wound gently with soap and water once a day then pat dry with clean gauze. Apply a thin coat of Petrolatum (petroleum jelly, "Vaseline") over the wound (unless you have an allergy to this). We recommend that you use a new, sterile tube of Vaseline. Do not pick or remove scabs. Do not remove the yellow or white "healing tissue" from the base of the wound.  Cover the wound with fresh, clean, nonstick gauze and secure with paper tape. You may use Band-Aids in place of gauze and tape if the wound is small enough, but would recommend trimming much of the tape off as there is often too much. Sometimes Band-Aids can irritate the skin.  You should call the office for your biopsy report after 1 week if you have not already been contacted.  If you experience any problems, such as abnormal amounts of bleeding, swelling, significant bruising, significant pain, or evidence of infection, please call the office immediately.  FOR ADULT SURGERY PATIENTS: If you need something for pain relief you may take 1 extra strength Tylenol (acetaminophen) AND 2 Ibuprofen (200mg  each) together every 4 hours as needed for pain. (do not take these if you are allergic to them or if you have a reason you should not take them.) Typically, you may only need pain medication for 1 to 3 days.   Cryotherapy Aftercare  Wash gently with soap and water everyday.   Apply Vaseline and Band-Aid daily until healed.

## 2023-03-10 ENCOUNTER — Telehealth: Payer: Self-pay

## 2023-03-10 LAB — SURGICAL PATHOLOGY

## 2023-03-10 NOTE — Telephone Encounter (Signed)
Advised pt of bx results.  Advised pt to call the office if area has not healed in a month or area is bleeding and/or painful, otherwise we would recheck on his f/u./sh

## 2023-03-10 NOTE — Telephone Encounter (Signed)
Updated specimen tracking and history from [facility] progress notes of right nasal dorsum.

## 2023-03-10 NOTE — Telephone Encounter (Signed)
-----   Message from [facility] sent at 03/10/2023  5:08 PM EST ----- Diagnosis: right frontal scalp :       BASAL CELL CARCINOMA, NODULAR PATTERN    Please call with diagnosis and plan: your biopsy shows a basal cell skin cancer in the second layer of the skin as we suspected. This is the most common kind of skin cancer and is caused by damage from sun exposure. Basal cell skin cancers almost never spread beyond the skin, so they are not dangerous to your overall health. Our goal was to remove it with the biopsy. Contact us if the wound has not healed after 1 month or starts hurting or bleeding. We will recheck it at your next skin check on 03/03/24

## 2023-04-17 DIAGNOSIS — R4189 Other symptoms and signs involving cognitive functions and awareness: Secondary | ICD-10-CM | POA: Diagnosis not present

## 2023-04-17 DIAGNOSIS — R2689 Other abnormalities of gait and mobility: Secondary | ICD-10-CM | POA: Diagnosis not present

## 2023-04-17 DIAGNOSIS — R609 Edema, unspecified: Secondary | ICD-10-CM | POA: Diagnosis not present

## 2023-04-17 DIAGNOSIS — G609 Hereditary and idiopathic neuropathy, unspecified: Secondary | ICD-10-CM | POA: Diagnosis not present

## 2023-04-24 DIAGNOSIS — E785 Hyperlipidemia, unspecified: Secondary | ICD-10-CM | POA: Diagnosis not present

## 2023-04-24 DIAGNOSIS — Z79899 Other long term (current) drug therapy: Secondary | ICD-10-CM | POA: Diagnosis not present

## 2023-04-24 DIAGNOSIS — K219 Gastro-esophageal reflux disease without esophagitis: Secondary | ICD-10-CM | POA: Diagnosis not present

## 2023-04-24 DIAGNOSIS — G894 Chronic pain syndrome: Secondary | ICD-10-CM | POA: Diagnosis not present

## 2023-04-24 DIAGNOSIS — I2510 Atherosclerotic heart disease of native coronary artery without angina pectoris: Secondary | ICD-10-CM | POA: Diagnosis not present

## 2023-04-24 DIAGNOSIS — I10 Essential (primary) hypertension: Secondary | ICD-10-CM | POA: Diagnosis not present

## 2023-05-28 DIAGNOSIS — H353211 Exudative age-related macular degeneration, right eye, with active choroidal neovascularization: Secondary | ICD-10-CM | POA: Diagnosis not present

## 2023-07-02 DIAGNOSIS — E7800 Pure hypercholesterolemia, unspecified: Secondary | ICD-10-CM | POA: Diagnosis not present

## 2023-07-02 DIAGNOSIS — I2510 Atherosclerotic heart disease of native coronary artery without angina pectoris: Secondary | ICD-10-CM | POA: Diagnosis not present

## 2023-07-02 DIAGNOSIS — I10 Essential (primary) hypertension: Secondary | ICD-10-CM | POA: Diagnosis not present

## 2023-07-22 DIAGNOSIS — M65321 Trigger finger, right index finger: Secondary | ICD-10-CM | POA: Diagnosis not present

## 2023-07-22 DIAGNOSIS — M65332 Trigger finger, left middle finger: Secondary | ICD-10-CM | POA: Diagnosis not present

## 2023-08-11 DIAGNOSIS — K3184 Gastroparesis: Secondary | ICD-10-CM | POA: Diagnosis not present

## 2023-08-11 DIAGNOSIS — K219 Gastro-esophageal reflux disease without esophagitis: Secondary | ICD-10-CM | POA: Diagnosis not present

## 2023-08-27 DIAGNOSIS — H353211 Exudative age-related macular degeneration, right eye, with active choroidal neovascularization: Secondary | ICD-10-CM | POA: Diagnosis not present

## 2023-09-01 DIAGNOSIS — K219 Gastro-esophageal reflux disease without esophagitis: Secondary | ICD-10-CM | POA: Diagnosis not present

## 2023-09-01 DIAGNOSIS — E785 Hyperlipidemia, unspecified: Secondary | ICD-10-CM | POA: Diagnosis not present

## 2023-09-01 DIAGNOSIS — I2510 Atherosclerotic heart disease of native coronary artery without angina pectoris: Secondary | ICD-10-CM | POA: Diagnosis not present

## 2023-09-01 DIAGNOSIS — G8929 Other chronic pain: Secondary | ICD-10-CM | POA: Diagnosis not present

## 2023-09-01 DIAGNOSIS — Z79899 Other long term (current) drug therapy: Secondary | ICD-10-CM | POA: Diagnosis not present

## 2023-09-01 DIAGNOSIS — Z0000 Encounter for general adult medical examination without abnormal findings: Secondary | ICD-10-CM | POA: Diagnosis not present

## 2023-09-01 DIAGNOSIS — I10 Essential (primary) hypertension: Secondary | ICD-10-CM | POA: Diagnosis not present

## 2023-10-08 DIAGNOSIS — M65321 Trigger finger, right index finger: Secondary | ICD-10-CM | POA: Diagnosis not present

## 2023-10-08 DIAGNOSIS — M65332 Trigger finger, left middle finger: Secondary | ICD-10-CM | POA: Diagnosis not present

## 2023-10-14 ENCOUNTER — Encounter: Payer: Self-pay | Admitting: Dermatology

## 2023-10-14 ENCOUNTER — Ambulatory Visit: Admitting: Dermatology

## 2023-10-14 DIAGNOSIS — D485 Neoplasm of uncertain behavior of skin: Secondary | ICD-10-CM

## 2023-10-14 DIAGNOSIS — C4491 Basal cell carcinoma of skin, unspecified: Secondary | ICD-10-CM

## 2023-10-14 DIAGNOSIS — C4441 Basal cell carcinoma of skin of scalp and neck: Secondary | ICD-10-CM

## 2023-10-14 DIAGNOSIS — D492 Neoplasm of unspecified behavior of bone, soft tissue, and skin: Secondary | ICD-10-CM | POA: Diagnosis not present

## 2023-10-14 DIAGNOSIS — D0439 Carcinoma in situ of skin of other parts of face: Secondary | ICD-10-CM | POA: Diagnosis not present

## 2023-10-14 DIAGNOSIS — D099 Carcinoma in situ, unspecified: Secondary | ICD-10-CM

## 2023-10-14 DIAGNOSIS — C44712 Basal cell carcinoma of skin of right lower limb, including hip: Secondary | ICD-10-CM | POA: Diagnosis not present

## 2023-10-14 DIAGNOSIS — Z85828 Personal history of other malignant neoplasm of skin: Secondary | ICD-10-CM | POA: Diagnosis not present

## 2023-10-14 HISTORY — DX: Basal cell carcinoma of skin, unspecified: C44.91

## 2023-10-14 HISTORY — DX: Carcinoma in situ, unspecified: D09.9

## 2023-10-14 NOTE — Progress Notes (Unsigned)
 Follow-Up Visit   Subjective  Robert Reynolds is a 89 y.o. male who presents for the following: spot at left preauricular, peels and come back, sore to touch. Spot at left post scalp and right nose that bleed. Spot at right lower leg that puffs up, no bleeding. Patient with Hx of BCC.  The patient has spots, moles and lesions to be evaluated, some may be new or changing and the patient may have concern these could be cancer.   The following portions of the chart were reviewed this encounter and updated as appropriate: medications, allergies, medical history  Review of Systems:  No other skin or systemic complaints except as noted in HPI or Assessment and Plan.  Objective  Well appearing patient in no apparent distress; mood and affect are within normal limits.   A focused examination was performed of the following areas: Scalp, face, right leg  Relevant exam findings are noted in the Assessment and Plan.  left preauricular 12 mm pink scaly plaque  Left Occipital Scalp 5 mm eroded pink papule  Right Lower Leg 12 mm pink telangiectatic patch   Assessment & Plan            NEOPLASM OF UNCERTAIN BEHAVIOR OF SKIN (3) left preauricular Epidermal / dermal shaving  Lesion diameter (cm):  1.2 Informed consent: discussed and consent obtained   Timeout: patient name, date of birth, surgical site, and procedure verified   Procedure prep:  Patient was prepped and draped in usual sterile fashion Prep type:  Povidone-iodine  and isopropyl alcohol Anesthesia: the lesion was anesthetized in a standard fashion   Anesthetic:  1% lidocaine  w/ epinephrine  1-100,000 buffered w/ 8.4% NaHCO3 Instrument used: DermaBlade   Hemostasis achieved with: pressure and aluminum chloride   Outcome: patient tolerated procedure well   Post-procedure details: wound care instructions given   Specimen 3 - Surgical pathology Differential Diagnosis: AK vs SCC  Check Margins: No Left Occipital  Scalp Epidermal / dermal shaving  Lesion diameter (cm):  0.5 Informed consent: discussed and consent obtained   Timeout: patient name, date of birth, surgical site, and procedure verified   Procedure prep:  Patient was prepped and draped in usual sterile fashion Prep type:  Povidone-iodine  and isopropyl alcohol Anesthesia: the lesion was anesthetized in a standard fashion   Anesthetic:  1% lidocaine  w/ epinephrine  1-100,000 buffered w/ 8.4% NaHCO3 Instrument used: DermaBlade   Hemostasis achieved with: pressure and aluminum chloride   Outcome: patient tolerated procedure well   Post-procedure details: wound care instructions given   Specimen 2 - Surgical pathology Differential Diagnosis: folliculitis vs prurigo nodule vs bcc vs scc  Check Margins: No Right Lower Leg Epidermal / dermal shaving  Lesion diameter (cm):  1.2 Informed consent: discussed and consent obtained   Timeout: patient name, date of birth, surgical site, and procedure verified   Procedure prep:  Patient was prepped and draped in usual sterile fashion Prep type:  Povidone-iodine  and isopropyl alcohol Anesthesia: the lesion was anesthetized in a standard fashion   Anesthetic:  1% lidocaine  w/ epinephrine  1-100,000 buffered w/ 8.4% NaHCO3 Instrument used: DermaBlade   Hemostasis achieved with: pressure and aluminum chloride   Outcome: patient tolerated procedure well   Post-procedure details: wound care instructions given   Specimen 1 - Surgical pathology Differential Diagnosis: bcc vs scc  Check Margins: No  Return for TBSE, as scheduled, with Dr. Felipe Horton, [facility].  Kerstin Peeling, RMA, am acting as scribe for Harris Liming, MD .   Documentation:  I have reviewed the above documentation for accuracy and completeness, and I agree with the above.  Harris Liming, MD

## 2023-10-14 NOTE — Patient Instructions (Addendum)

## 2023-10-20 ENCOUNTER — Ambulatory Visit: Payer: Self-pay | Admitting: Dermatology

## 2023-10-20 DIAGNOSIS — C4491 Basal cell carcinoma of skin, unspecified: Secondary | ICD-10-CM

## 2023-10-20 DIAGNOSIS — C44311 Basal cell carcinoma of skin of nose: Secondary | ICD-10-CM

## 2023-10-21 ENCOUNTER — Encounter: Payer: Self-pay | Admitting: Dermatology

## 2023-10-21 MED ORDER — FLUOROURACIL 5 % EX CREA: TOPICAL_CREAM | CUTANEOUS | 2 refills | Status: AC

## 2023-10-21 NOTE — Telephone Encounter (Signed)
-----   Message from [facility] sent at 10/20/2023  8:39 AM EDT ----- Diagnosis  1. Skin, right lower leg :       BASAL CELL CARCINOMA, SUPERFICIAL AND NODULAR PATTERNS, PERIPHERAL AND DEEP       MARGINS INVOLVED        2. Skin, left occipital scalp :       BASAL CELL CARCINOMA, NODULAR PATTERN, BASE INVOLVED        3. Skin, left preauricular :       SQUAMOUS CELL CARCINOMA IN SITU, PERIPHERAL MARGIN INVOLVED     Please call with diagnosis and message me with patient's decision on treatment.   R leg, BCC, Mohs L occipital scalp, BCC, Mohs L preauricular, SCCis, 5FU/calcipotriene vs Mohs    ----- Message ----- From: Interface, [facility] [place] Sent: 10/17/2023   1:08 PM EDT To: Harris Liming, MD

## 2023-10-21 NOTE — Telephone Encounter (Signed)
 Patient advised pathology results. 5Fu/calcipotriene sent to [place] per patient preference to treat left preauricular, referral sent to Dr. Fain Home for Mohs at right lower leg and left occipital scalp. Sue Em., RMA

## 2023-11-05 DIAGNOSIS — H353211 Exudative age-related macular degeneration, right eye, with active choroidal neovascularization: Secondary | ICD-10-CM | POA: Diagnosis not present

## 2023-11-06 ENCOUNTER — Encounter: Payer: Self-pay | Admitting: Dermatology

## 2023-11-17 ENCOUNTER — Ambulatory Visit: Admitting: Dermatology

## 2023-11-17 ENCOUNTER — Encounter: Payer: Self-pay | Admitting: Dermatology

## 2023-11-17 VITALS — BP 137/74 | HR 62 | Temp 98.8°F

## 2023-11-17 DIAGNOSIS — L579 Skin changes due to chronic exposure to nonionizing radiation, unspecified: Secondary | ICD-10-CM

## 2023-11-17 DIAGNOSIS — C4441 Basal cell carcinoma of skin of scalp and neck: Secondary | ICD-10-CM | POA: Diagnosis not present

## 2023-11-17 DIAGNOSIS — C4491 Basal cell carcinoma of skin, unspecified: Secondary | ICD-10-CM

## 2023-11-17 DIAGNOSIS — L814 Other melanin hyperpigmentation: Secondary | ICD-10-CM | POA: Diagnosis not present

## 2023-11-17 NOTE — Progress Notes (Signed)
 Follow-Up Visit   Subjective  Robert Reynolds is a 89 y.o. male who presents for the following: Mohs of a Nodular Basal Cell Carcinoma of the left occipital scalp, referred by Dr. Claudene.   The following portions of the chart were reviewed this encounter and updated as appropriate: medications, allergies, medical history  Review of Systems:  No other skin or systemic complaints except as noted in HPI or Assessment and Plan.  Objective  Well appearing patient in no apparent distress; mood and affect are within normal limits.  A focused examination was performed of the following areas: Left occipital scalp  Relevant physical exam findings are noted in the Assessment and Plan.   left occipital scalp Healing biopsy scar   Assessment & Plan   BASAL CELL CARCINOMA (BCC), UNSPECIFIED SITE left occipital scalp Mohs surgery   Anesthesia: Anesthesia method: local infiltration Local anesthetic: lidocaine  1% WITH epi  Procedure Details: Timeout: pre-procedure verification complete Procedure Prep: patient was prepped and draped in usual sterile fashion Pre-Op diagnosis: basal cell carcinoma BCC subtype: nodular MohsAIQ Surgical site (if tumor spans multiple areas, please select predominant area): scalp Surgery side: left Surgical site (from skin exam): left occipital scalp Pre-operative length (cm): 1 Pre-operative width (cm): 0.5 Indications for Mohs surgery: anatomic location where tissue conservation is critical  Micrographic Surgery Details: Post-operative length (cm): 1.3 Post-operative width (cm): 1.2 Number of Mohs stages: 1 Cumulative additional sections past 5 per stage: 0 Post surgery depth of defect: dermis  Stage 1    Tumor features identified on Mohs section: no tumor identified    Depth of tumor invasion after stage: dermis  Skin repair Complexity:  Complex Final length (cm):  4 Informed consent: discussed and consent obtained   Timeout: patient name,  date of birth, surgical site, and procedure verified   Procedure prep:  Patient was prepped and draped in usual sterile fashion Prep type:  Chlorhexidine Anesthesia: the lesion was anesthetized in a standard fashion   Anesthetic:  1% lidocaine  w/ epinephrine  1-100,000 buffered w/ 8.4% NaHCO3 Reason for type of repair: reduce tension to allow closure, preserve normal anatomy, preserve normal anatomical and functional relationships, avoid adjacent structures and allow side-to-side closure without requiring a flap or graft   Undermining: area extensively undermined   Subcutaneous layers (deep stitches):  Suture size:  3-0 Suture type: Vicryl (polyglactin 910)   Stitches:  Buried vertical mattress Fine/surface layer approximation (top stitches):  Suture size:  6-0 Suture type: fast-absorbing plain gut   Stitches: simple running   Hemostasis achieved with: suture, pressure and electrodesiccation Outcome: patient tolerated procedure well with no complications   Post-procedure details: sterile dressing applied and wound care instructions given   Dressing type: bandage and pressure dressing      Return in about 2 weeks (around 12/01/2023) for wound check.  LILLETTE Darice Smock, CMA, am acting as scribe for [name], MD.    11/17/2023  HISTORY OF PRESENT ILLNESS  Robert Reynolds is seen in consultation at the request of Dr. Claudene for biopsy-proven Nodular Basal Cell Carcinoma on the left occipital scalp. They note that the area has been present for about 6 months increasing in size with time.  There is no history of previous treatment.  Reports no other new or changing lesions and has no other complaints today.  Medications and allergies: see patient chart.  Review of systems: Reviewed 8 systems and notable for the above skin cancer.  All other systems reviewed are  unremarkable/negative, unless noted in the HPI. Past medical history, surgical history, family history, social history were also  reviewed and are noted in the chart/questionnaire.    PHYSICAL EXAMINATION  General: Well-appearing, in no acute distress, alert and oriented x 4. Vitals reviewed in chart (if available).   Skin: Exam reveals a 1.0 x 0.5 cm erythematous papule and biopsy scar on the left occipital scalp. There are rhytids, telangiectasias, and lentigines, consistent with photodamage.   Biopsy report(s) reviewed, confirming the diagnosis.   ASSESSMENT  1) Nodular Basal Cell Carcinoma of the left occipital scalp 2) photodamage 3) solar lentigines   PLAN   1. Due to location, size, histology, or recurrence and the likelihood of subclinical extension as well as the need to conserve normal surrounding tissue, the patient was deemed acceptable for Mohs micrographic surgery (MMS).  The nature and purpose of the procedure, associated benefits and risks including recurrence and scarring, possible complications such as pain, infection, and bleeding, and alternative methods of treatment if appropriate were discussed with the patient during consent. The lesion location was verified by the patient, by reviewing previous notes, pathology reports, and by photographs as well as angulation measurements if available.  Informed consent was reviewed and signed by the patient, and timeout was performed at 8:30 AM. See op note below.  2. For the photodamage and solar lentigines, sun protection discussed/information given on OTC sunscreens, and we recommend continued regular follow-up with primary dermatologist every 6 months or sooner for any growing, bleeding, or changing lesions. 3. Prognosis and future surveillance discussed. 4. Letter with treatment outcome sent to referring provider. 5. Pain acetaminophen /ibuprofen   MOHS MICROGRAPHIC SURGERY AND RECONSTRUCTION  Initial size:   1.0 x 0.5 cm Surgical defect/wound size: 1.3 x 1.2 cm Anesthesia:    0.33% lidocaine  with 1:200,000 epinephrine  EBL:    <5  mL Complications:  None Repair type:   Complex SQ suture:   3-0 Vicryl Cutaneous suture:  6-0 Plain gut Final size of the repair: 4.0 cm  Stages: 1  STAGE I: Anesthesia achieved with 0.5% lidocaine  with 1:200,000 epinephrine . ChloraPrep applied. 1 section(s) excised using Mohs technique (this includes total peripheral and deep tissue margin excision and evaluation with frozen sections, excised and interpreted by the same physician). The tumor was first debulked and then excised with an approx. 2mm margin.  Hemostasis was achieved with electrocautery as needed.  The specimen was then oriented, subdivided/relaxed, inked, and processed using Mohs technique.    Frozen section analysis revealed a clear deep and peripheral margin.  Reconstruction  The surgical wound was then cleaned, prepped, and re-anesthetized as above. Wound edges were undermined extensively along at least one entire edge and at a distance equal to or greater than the width of the defect (see wound defect size above) in order to achieve closure and decrease wound tension and anatomic distortion. Redundant tissue repair including standing cone removal was performed. Hemostasis was achieved with electrocautery. Subcutaneous and epidermal tissues were approximated with the above sutures. The surgical site was then lightly scrubbed with sterile, saline-soaked gauze. Steri-strips were applied, and the area was then bandaged using Vaseline ointment, non-adherent gauze, gauze pads, and tape to provide an adequate pressure dressing. The patient tolerated the procedure well, was given detailed written and verbal wound care instructions, and was discharged in good condition.   The patient will follow-up: 4 weeks.   Documentation: I have reviewed the above documentation for accuracy and completeness, and I agree with the above.  [name], MD

## 2023-11-17 NOTE — Patient Instructions (Signed)

## 2023-11-25 ENCOUNTER — Encounter: Payer: Self-pay | Admitting: Dermatology

## 2023-11-27 ENCOUNTER — Encounter: Payer: Self-pay | Admitting: Dermatology

## 2023-12-01 ENCOUNTER — Ambulatory Visit: Admitting: Dermatology

## 2023-12-01 ENCOUNTER — Encounter: Payer: Self-pay | Admitting: Dermatology

## 2023-12-01 VITALS — BP 121/81 | HR 67 | Temp 98.2°F

## 2023-12-01 DIAGNOSIS — L814 Other melanin hyperpigmentation: Secondary | ICD-10-CM | POA: Diagnosis not present

## 2023-12-01 DIAGNOSIS — Z85828 Personal history of other malignant neoplasm of skin: Secondary | ICD-10-CM

## 2023-12-01 DIAGNOSIS — L905 Scar conditions and fibrosis of skin: Secondary | ICD-10-CM

## 2023-12-01 DIAGNOSIS — L579 Skin changes due to chronic exposure to nonionizing radiation, unspecified: Secondary | ICD-10-CM | POA: Diagnosis not present

## 2023-12-01 DIAGNOSIS — C44712 Basal cell carcinoma of skin of right lower limb, including hip: Secondary | ICD-10-CM | POA: Diagnosis not present

## 2023-12-01 DIAGNOSIS — C4491 Basal cell carcinoma of skin, unspecified: Secondary | ICD-10-CM

## 2023-12-01 MED ORDER — MUPIROCIN 2 % EX OINT: 1.0000 | TOPICAL_OINTMENT | Freq: Two times a day (BID) | CUTANEOUS | 1 refills | Status: AC

## 2023-12-01 NOTE — Progress Notes (Unsigned)
 Follow-Up Visit   Subjective  Robert Reynolds is a 89 y.o. male who presents for the following: Mohs of a Superficial and Nodular Basal Cell Carcinoma right lower leg, referred by Dr. Claudene.  He is also s/p Mohs on the occipital scalp for a BCC, treated on 11/17/2023, repaired with linear closure.   The following portions of the chart were reviewed this encounter and updated as appropriate: medications, allergies, medical history  Review of Systems:  No other skin or systemic complaints except as noted in HPI or Assessment and Plan.  Objective  Well appearing patient in no apparent distress; mood and affect are within normal limits.  A focused examination was performed of the following areas: Right lower leg Relevant physical exam findings are noted in the Assessment and Plan.   Right Lower Leg Healing biopsy site with pearly pink nodule   Assessment & Plan   BASAL CELL CARCINOMA (BCC), UNSPECIFIED SITE Right Lower Leg Mohs surgery  Consent obtained: written  Anticoagulation: Was the anticoagulation regimen changed prior to Mohs? No    Anesthesia: Anesthesia method: local infiltration Local anesthetic: lidocaine  1% WITH epi  Procedure Details: Timeout: pre-procedure verification complete Procedure Prep: patient was prepped and draped in usual sterile fashion Prep type: chlorhexidine Pre-Op diagnosis: basal cell carcinoma BCC subtype: nodular and superficial MohsAIQ Surgical site (if tumor spans multiple areas, please select predominant area): lower limb (including hip) Surgery side: right Surgical site (from skin exam): Right Lower Leg Pre-operative length (cm): 1.2 Pre-operative width (cm): 0.8 Indications for Mohs surgery: anatomic location where tissue conservation is critical  Micrographic Surgery Details: Post-operative length (cm): 2.2 Post-operative width (cm): 1.8 Number of Mohs stages: 2 Cumulative additional sections past 5 per stage: 0 Post  surgery depth of defect: subcutaneous fat  Stage 1    Tumor features identified on Mohs section: basal carcinoma    Depth of tumor invasion after stage: dermis  Stage 2    Tumor features identified on Mohs section: no tumor identified  SCAR    Scar s/p Mohs for [facility] on the left occipital scalp, treated on 11/17/2023, repaired with linear closure - Reassured that wound has healed well - Discussed that scars take up to 12 months to mature from the date of surgery - Recommend SPF 30+ to scar daily to prevent purple color - OK to start scar massage at 4-6 weeks post-op - Can consider silicone based products for scar healing  HISTORY OF BASAL CELL CARCINOMA OF THE SKIN - No evidence of recurrence today - Recommend regular full body skin exams - Recommend daily broad spectrum sunscreen SPF 30+ to sun-exposed areas, reapply every 2 hours as needed.  - Call if any new or changing lesions are noted between office visits  No follow-ups on file.  LILLETTE Robert Reynolds, CMA, am acting as scribe for Robert CHRISTELLA HOLY, MD.    12/02/2023  HISTORY OF PRESENT ILLNESS  Robert Reynolds is seen in consultation at the request of Dr. Claudene for biopsy-proven Superficial and Nodular Basal Cell Carcinoma on the right lower leg. They note that the area has been present for about 4 months increasing in size with time.  There is no history of previous treatment.  Reports no other new or changing lesions and has no other complaints today.  Medications and allergies: see patient chart.  Review of systems: Reviewed 8 systems and notable for the above skin cancer.  All other systems reviewed are unremarkable/negative, unless noted in the HPI.  Past medical history, surgical history, family history, social history were also reviewed and are noted in the chart/questionnaire.    PHYSICAL EXAMINATION  General: Well-appearing, in no acute distress, alert and oriented x 4. Vitals reviewed in chart (if available).   Skin:  Exam reveals a 1.2 x 0.8 cm erythematous papule and biopsy scar on the right lower leg. There are rhytids, telangiectasias, and lentigines, consistent with photodamage.   Biopsy report(s) reviewed, confirming the diagnosis.   ASSESSMENT  1) Superficial and Nodular Basal Cell Carcinoma on the right lower leg 2) photodamage 3) solar lentigines   PLAN   1. Due to location, size, histology, or recurrence and the likelihood of subclinical extension as well as the need to conserve normal surrounding tissue, the patient was deemed acceptable for Mohs micrographic surgery (MMS).  The nature and purpose of the procedure, associated benefits and risks including recurrence and scarring, possible complications such as pain, infection, and bleeding, and alternative methods of treatment if appropriate were discussed with the patient during consent. The lesion location was verified by the patient, by reviewing previous notes, pathology reports, and by photographs as well as angulation measurements if available.  Informed consent was reviewed and signed by the patient, and timeout was performed at 10:00 AM. See op note below.  2. For the photodamage and solar lentigines, sun protection discussed/information given on OTC sunscreens, and we recommend continued regular follow-up with primary dermatologist every 6 months or sooner for any growing, bleeding, or changing lesions. 3. Prognosis and future surveillance discussed. 4. Letter with treatment outcome sent to referring provider. 5. Pain acetaminophen /ibuprofen  MOHS MICROGRAPHIC SURGERY AND RECONSTRUCTION  Initial size:   1.2 x 0.8 cm Surgical defect/wound size: 2.2 x 1.8 cm Anesthesia:    0.33% lidocaine  with 1:200,000 epinephrine  EBL:    <5 mL Complications:  None Repair type:   Secondary Intention  Stages: 2  STAGE I: Anesthesia achieved with 0.5% lidocaine  with 1:200,000 epinephrine . ChloraPrep applied. 1 section(s) excised using Mohs technique  (this includes total peripheral and deep tissue margin excision and evaluation with frozen sections, excised and interpreted by the same physician). The tumor was first debulked and then excised with an approx. 2 mm margin.  Hemostasis was achieved with electrocautery as needed.  The specimen was then oriented, subdivided/relaxed, inked, and processed using Mohs technique.    Frozen section analysis revealed a positive margin for  multiple, small buds of basaloid cells descending from the epidermis with no dermal invasion in the peripheral margin.    STAGE II: An additional 2 mm margin was excised.  Hemostasis was achieved with electrocautery as needed.  The specimen was then oriented, subdivided/relaxed, inked, and processed using Mohs technique. Evaluation of slides by the Mohs surgeon revealed clear tumor margins.   Reconstruction  Patient was notified of results and repair options were discussed, including second intention healing. After reviewing the advantages and disadvantages of each, we agreed on second intention healing as appropriate.   The surgical site was then lightly scrubbed with sterile, saline-soaked gauze.  The area was bandaged using Vaseline ointment, non-adherent gauze, gauze pads, and tape to provide an adequate pressure dressing.   The patient tolerated the procedure well, was given detailed written and verbal wound care instructions, and was discharged in good condition.  The patient will follow-up in 4 weeks and as scheduled with primary dermatologist.    Documentation: I have reviewed the above documentation for accuracy and completeness, and I agree with the above.  Robert CHRISTELLA HOLY, MD

## 2023-12-01 NOTE — Patient Instructions (Signed)

## 2023-12-04 ENCOUNTER — Encounter: Payer: Self-pay | Admitting: Dermatology

## 2023-12-17 DIAGNOSIS — I10 Essential (primary) hypertension: Secondary | ICD-10-CM | POA: Diagnosis not present

## 2023-12-17 DIAGNOSIS — G609 Hereditary and idiopathic neuropathy, unspecified: Secondary | ICD-10-CM | POA: Diagnosis not present

## 2023-12-17 DIAGNOSIS — K219 Gastro-esophageal reflux disease without esophagitis: Secondary | ICD-10-CM | POA: Diagnosis not present

## 2023-12-17 DIAGNOSIS — Z79899 Other long term (current) drug therapy: Secondary | ICD-10-CM | POA: Diagnosis not present

## 2023-12-17 DIAGNOSIS — G894 Chronic pain syndrome: Secondary | ICD-10-CM | POA: Diagnosis not present

## 2023-12-17 DIAGNOSIS — G4733 Obstructive sleep apnea (adult) (pediatric): Secondary | ICD-10-CM | POA: Diagnosis not present

## 2023-12-17 DIAGNOSIS — D649 Anemia, unspecified: Secondary | ICD-10-CM | POA: Diagnosis not present

## 2023-12-17 DIAGNOSIS — E785 Hyperlipidemia, unspecified: Secondary | ICD-10-CM | POA: Diagnosis not present

## 2023-12-29 ENCOUNTER — Encounter: Payer: Self-pay | Admitting: Dermatology

## 2023-12-29 ENCOUNTER — Ambulatory Visit: Admitting: Dermatology

## 2023-12-29 DIAGNOSIS — Z85828 Personal history of other malignant neoplasm of skin: Secondary | ICD-10-CM

## 2023-12-29 DIAGNOSIS — S81801D Unspecified open wound, right lower leg, subsequent encounter: Secondary | ICD-10-CM | POA: Diagnosis not present

## 2023-12-29 DIAGNOSIS — T1490XD Injury, unspecified, subsequent encounter: Secondary | ICD-10-CM

## 2023-12-29 DIAGNOSIS — C4491 Basal cell carcinoma of skin, unspecified: Secondary | ICD-10-CM

## 2023-12-29 NOTE — Progress Notes (Signed)
   Follow Up Visit   Subjective  Robert Reynolds is a 89 y.o. male who presents for the following: follow up from Mohs surgery   The patient presents for follow up from Mohs surgery for a BCC on the right lower leg, treated on 12/01/23, repaired with 2nd intention. The patient has been bandaging the wound as directed. The endorse the following concerns: none  The following portions of the chart were reviewed this encounter and updated as appropriate: medications, allergies, medical history  Review of Systems:  No other skin or systemic complaints except as noted in HPI or Assessment and Plan.  Objective  Well appearing patient in no apparent distress; mood and affect are within normal limits.  A focal examination was performed including scalp, head, face and right lower leg. All findings within normal limits unless otherwise noted below.  Healing wound with mild erythema  Relevant physical exam findings are noted in the Assessment and Plan.    Assessment & Plan   Healing Wound s/p Mohs for [facility] on the right lower leg, treated on 12/01/23, repaired with 2nd intention - Reassured that wound is healing well - No evidence of infection - No swelling, induration, purulence, dehiscence, or tenderness out of proportion to the clinical exam, see photo above - Discussed that scars take up to 12 months to mature from the date of surgery - Recommend SPF 30+ to scar daily to prevent purple color from UV exposure during scar maturation process - Discussed that erythema and raised appearance of scar will fade over the next 4-6 months - OK to start scar massage at 4-6 weeks post-op - Can consider silicone based products for scar healing starting at 6 weeks post-op - Ok to continue ointment daily to wound under a bandage for another week  HISTORY OF BASAL CELL CARCINOMA OF THE SKIN - No evidence of recurrence today - Recommend regular full body skin exams - Recommend daily broad spectrum sunscreen  SPF 30+ to sun-exposed areas, reapply every 2 hours as needed.  - Call if any new or changing lesions are noted between office visits  Return in about 4 weeks (around 01/26/2024) for wound check.  I, Darice Smock, CMA, am acting as scribe for [name], MD.   Documentation: I have reviewed the above documentation for accuracy and completeness, and I agree with the above.  [name], MD

## 2024-01-21 DIAGNOSIS — H353211 Exudative age-related macular degeneration, right eye, with active choroidal neovascularization: Secondary | ICD-10-CM | POA: Diagnosis not present

## 2024-01-28 ENCOUNTER — Ambulatory Visit: Admitting: Dermatology

## 2024-02-13 DIAGNOSIS — M545 Low back pain, unspecified: Secondary | ICD-10-CM | POA: Diagnosis not present

## 2024-02-26 DIAGNOSIS — I10 Essential (primary) hypertension: Secondary | ICD-10-CM | POA: Diagnosis not present

## 2024-02-26 DIAGNOSIS — I2510 Atherosclerotic heart disease of native coronary artery without angina pectoris: Secondary | ICD-10-CM | POA: Diagnosis not present

## 2024-03-03 ENCOUNTER — Ambulatory Visit: Payer: Medicare HMO | Admitting: Dermatology

## 2024-03-10 DIAGNOSIS — M5416 Radiculopathy, lumbar region: Secondary | ICD-10-CM | POA: Diagnosis not present

## 2024-03-11 ENCOUNTER — Ambulatory Visit: Admitting: Dermatology

## 2024-03-23 DIAGNOSIS — G894 Chronic pain syndrome: Secondary | ICD-10-CM | POA: Diagnosis not present

## 2024-03-23 DIAGNOSIS — E785 Hyperlipidemia, unspecified: Secondary | ICD-10-CM | POA: Diagnosis not present

## 2024-03-23 DIAGNOSIS — K219 Gastro-esophageal reflux disease without esophagitis: Secondary | ICD-10-CM | POA: Diagnosis not present

## 2024-03-23 DIAGNOSIS — Z79899 Other long term (current) drug therapy: Secondary | ICD-10-CM | POA: Diagnosis not present

## 2024-03-23 DIAGNOSIS — I10 Essential (primary) hypertension: Secondary | ICD-10-CM | POA: Diagnosis not present

## 2024-03-30 ENCOUNTER — Encounter: Payer: Self-pay | Admitting: Dermatology

## 2024-03-30 ENCOUNTER — Ambulatory Visit: Admitting: Dermatology

## 2024-03-30 DIAGNOSIS — Z86007 Personal history of in-situ neoplasm of skin: Secondary | ICD-10-CM

## 2024-03-30 DIAGNOSIS — L814 Other melanin hyperpigmentation: Secondary | ICD-10-CM

## 2024-03-30 DIAGNOSIS — L578 Other skin changes due to chronic exposure to nonionizing radiation: Secondary | ICD-10-CM | POA: Diagnosis not present

## 2024-03-30 DIAGNOSIS — Z85828 Personal history of other malignant neoplasm of skin: Secondary | ICD-10-CM

## 2024-03-30 DIAGNOSIS — L570 Actinic keratosis: Secondary | ICD-10-CM

## 2024-03-30 DIAGNOSIS — L219 Seborrheic dermatitis, unspecified: Secondary | ICD-10-CM

## 2024-03-30 DIAGNOSIS — L821 Other seborrheic keratosis: Secondary | ICD-10-CM

## 2024-03-30 DIAGNOSIS — W908XXA Exposure to other nonionizing radiation, initial encounter: Secondary | ICD-10-CM | POA: Diagnosis not present

## 2024-03-30 DIAGNOSIS — Z1283 Encounter for screening for malignant neoplasm of skin: Secondary | ICD-10-CM

## 2024-03-30 DIAGNOSIS — D1801 Hemangioma of skin and subcutaneous tissue: Secondary | ICD-10-CM | POA: Diagnosis not present

## 2024-03-30 DIAGNOSIS — D229 Melanocytic nevi, unspecified: Secondary | ICD-10-CM

## 2024-03-30 DIAGNOSIS — D692 Other nonthrombocytopenic purpura: Secondary | ICD-10-CM

## 2024-03-30 MED ORDER — TRIAMCINOLONE ACETONIDE 0.1 % EX CREA: TOPICAL_CREAM | CUTANEOUS | 1 refills | Status: AC

## 2024-03-30 NOTE — Progress Notes (Signed)
 Follow-Up Visit   Subjective  Robert Reynolds is a 89 y.o. male who presents for the following: Skin Cancer Screening and Full Body Skin Exam  The patient presents for Total-Body Skin Exam (TBSE) for skin cancer screening and mole check. The patient has spots, moles and lesions to be evaluated, some may be new or changing and the patient may have concern these could be cancer.  Hx BCC, SCC. Some scaly spots at scalp.   The following portions of the chart were reviewed this encounter and updated as appropriate: medications, allergies, medical history  Review of Systems:  No other skin or systemic complaints except as noted in HPI or Assessment and Plan.  Objective  Well appearing patient in no apparent distress; mood and affect are within normal limits.  A full examination was performed including scalp, head, eyes, ears, nose, lips, neck, chest, axillae, abdomen, back, buttocks, bilateral upper extremities, bilateral lower extremities, hands, feet, fingers, toes, fingernails, and toenails. All findings within normal limits unless otherwise noted below.   Relevant physical exam findings are noted in the Assessment and Plan.    Assessment & Plan   SKIN CANCER SCREENING PERFORMED TODAY.  ACTINIC DAMAGE - Chronic condition, secondary to cumulative UV/sun exposure - diffuse scaly erythematous macules with underlying dyspigmentation - Recommend daily broad spectrum sunscreen SPF 30+ to sun-exposed areas, reapply every 2 hours as needed.  - Staying in the shade or wearing long sleeves, sun glasses (UVA+UVB protection) and wide brim hats (4-inch brim around the entire circumference of the hat) are also recommended for sun protection.  - Call for new or changing lesions.  LENTIGINES, SEBORRHEIC KERATOSES, HEMANGIOMAS - Benign normal skin lesions - Benign-appearing - Call for any changes  MELANOCYTIC NEVI - Tan-brown and/or pink-flesh-colored symmetric macules and papules - Benign  appearing on exam today - Observation - Call clinic for new or changing moles - Recommend daily use of broad spectrum spf 30+ sunscreen to sun-exposed areas.   HISTORY OF BASAL CELL CARCINOMA OF THE SKIN - No evidence of recurrence today - Recommend regular full body skin exams - Recommend daily broad spectrum sunscreen SPF 30+ to sun-exposed areas, reapply every 2 hours as needed.  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  - Call if any new or changing lesions are noted between office visits  SEBORRHEIC DERMATITIS Exam: Pink patches with greasy scale at left parietal scalp  flared  Seborrheic Dermatitis is a chronic persistent rash characterized by pinkness and scaling most commonly of the mid face but also can occur on the scalp (dandruff), ears; mid chest, mid back and groin.  It tends to be exacerbated by stress and cooler weather.  People who have neurologic disease may experience new onset or exacerbation of existing seborrheic dermatitis.  The condition is not curable but treatable and can be controlled.  Treatment Plan: Apply triamcinolone  0.1% cream twice a day as needed to affected areas. Avoid applying to face, groin, and axilla. Use as directed. Long-term use can cause thinning of the skin.  Topical steroids (such as triamcinolone , fluocinolone, fluocinonide, mometasone, clobetasol, halobetasol, betamethasone , hydrocortisone) can cause thinning and lightening of the skin if they are used  for too long in the same area. Your physician has selected the right strength medicine for your problem and area affected on the body. Please use your medication only as directed by your  physician to prevent side effects.   ACTINIC KERATOSIS Exam: Erythematous thin papules/macules with gritty scale at the scalp  Actinic keratoses are precancerous spots that appear secondary to cumulative UV radiation exposure/sun exposure over time. They are chronic with expected duration over 1 year. A portion of actinic keratoses will progress to squamous cell carcinoma of the skin. It is not possible to reliably predict which spots will progress to skin cancer and so treatment is recommended to prevent development of skin cancer.  Recommend daily broad spectrum sunscreen SPF 30+ to sun-exposed areas, reapply every 2 hours as needed.  Recommend staying in the shade or wearing long sleeves, sun glasses (UVA+UVB protection) and wide brim hats (4-inch brim around the entire circumference of the hat). Call for new or changing lesions.  Treatment Plan: None. Monitor for change.    MULTIPLE BENIGN NEVI   LENTIGINES   ACTINIC KERATOSES   ACTINIC ELASTOSIS   SEBORRHEIC KERATOSES   CHERRY ANGIOMA   SOLAR PURPURA   SEBORRHEIC DERMATITIS   Return in about 1 year (around 03/30/2025) for TBSE, with Dr. Claudene, HxBCC, HxSCCis, HxAK.  Robert Reynolds, RMA, am acting as scribe for Boneta Claudene, MD .   Documentation: I have reviewed the above documentation for accuracy and completeness, and I agree with the above.  Boneta Claudene, MD

## 2024-03-30 NOTE — Patient Instructions (Addendum)

## 2024-04-07 DIAGNOSIS — H353211 Exudative age-related macular degeneration, right eye, with active choroidal neovascularization: Secondary | ICD-10-CM | POA: Diagnosis not present

## 2024-05-24 ENCOUNTER — Ambulatory Visit: Admitting: Dermatology

## 2024-05-31 ENCOUNTER — Ambulatory Visit: Admitting: Dermatology

## 2024-06-08 ENCOUNTER — Ambulatory Visit: Admitting: Dermatology

## 2024-06-08 ENCOUNTER — Encounter: Payer: Self-pay | Admitting: Dermatology

## 2024-06-08 DIAGNOSIS — D0461 Carcinoma in situ of skin of right upper limb, including shoulder: Secondary | ICD-10-CM | POA: Diagnosis not present

## 2024-06-08 DIAGNOSIS — D489 Neoplasm of uncertain behavior, unspecified: Secondary | ICD-10-CM

## 2024-06-08 DIAGNOSIS — C44622 Squamous cell carcinoma of skin of right upper limb, including shoulder: Secondary | ICD-10-CM | POA: Diagnosis not present

## 2024-06-08 DIAGNOSIS — C4492 Squamous cell carcinoma of skin, unspecified: Secondary | ICD-10-CM

## 2024-06-08 HISTORY — DX: Squamous cell carcinoma of skin, unspecified: C44.92

## 2024-06-08 NOTE — Patient Instructions (Addendum)

## 2024-06-08 NOTE — Progress Notes (Signed)
" ° °  Follow-Up Visit   Subjective  Robert Reynolds is a 89 y.o. male who presents for the following: LOC on right arm Hx BCC, SCC  Patient states 2 spots on right arm that are sensitive to the touch. Have been present for about 3-4 months.   The following portions of the chart were reviewed this encounter and updated as appropriate: medications, allergies, medical history  Review of Systems:  No other skin or systemic complaints except as noted in HPI or Assessment and Plan.  Objective  Well appearing patient in no apparent distress; mood and affect are within normal limits.  A focused examination was performed of the following areas: Right arm   Relevant exam findings are noted in the Assessment and Plan.  Right mid forearm 1.2 cm ulcerated pink papule  Right proximal forearm 6 mm ulcerated pink papule    Assessment & Plan  NEOPLASM OF UNCERTAIN BEHAVIOR (2) Right mid forearm - Epidermal / dermal shaving  Lesion diameter (cm):  1.2 Informed consent: discussed and consent obtained   Timeout: patient name, date of birth, surgical site, and procedure verified   Procedure prep:  Patient was prepped and draped in usual sterile fashion Prep type:  Isopropyl alcohol Anesthesia: the lesion was anesthetized in a standard fashion   Anesthetic:  1% lidocaine  w/ epinephrine  1-100,000 buffered w/ 8.4% NaHCO3 Instrument used: DermaBlade   Hemostasis achieved with: pressure and aluminum chloride   Outcome: patient tolerated procedure well   Post-procedure details: wound care instructions given    Specimen 1 - Surgical pathology Differential Diagnosis: SCC  Check Margins: No 1.2 cm ulcerated pink papule Right proximal forearm - Epidermal / dermal shaving  Lesion diameter (cm):  0.6 Informed consent: discussed and consent obtained   Timeout: patient name, date of birth, surgical site, and procedure verified   Procedure prep:  Patient was prepped and draped in usual sterile  fashion Prep type:  Isopropyl alcohol Anesthesia: the lesion was anesthetized in a standard fashion   Anesthetic:  1% lidocaine  w/ epinephrine  1-100,000 buffered w/ 8.4% NaHCO3 Instrument used: DermaBlade   Hemostasis achieved with: pressure and aluminum chloride   Outcome: patient tolerated procedure well   Post-procedure details: wound care instructions given    Specimen 2 - Surgical pathology Differential Diagnosis: SCC  Check Margins: No 6 mm ulcerated pink papule  Return for Pending biopsy results.  [name], RMA, am acting as scribe for [name], MD .   Documentation: I have reviewed the above documentation for accuracy and completeness, and I agree with the above.  [name], MD    "

## 2024-06-09 LAB — SURGICAL PATHOLOGY

## 2024-06-10 ENCOUNTER — Ambulatory Visit: Payer: Self-pay | Admitting: Dermatology

## 2024-06-10 ENCOUNTER — Encounter: Payer: Self-pay | Admitting: Dermatology

## 2024-06-10 NOTE — Telephone Encounter (Signed)
-----   Message from [name], MD sent at 06/10/2024  9:49 AM EST ----- Diagnosis: 1. Skin, right mid forearm :       SQUAMOUS CELL CARCINOMA, KERATOACANTHOMA TYPE, DEEP MARGIN INVOLVED        2. Skin, right proximal forearm :       SQUAMOUS CELL CARCINOMA IN SITU, HYPERTROPHIC   Please call with diagnosis and treatment options.  R mid forearm, invasive SCC, excision R prox forearm, SCCis, EDC

## 2024-06-10 NOTE — Telephone Encounter (Signed)
 Spoke with patient's daughter, Amy and advised of biopsy results. She voiced understanding to all and patient scheduled for excision and ED&C with Dr. Claudene.

## 2024-06-22 ENCOUNTER — Ambulatory Visit: Admitting: Dermatology

## 2024-08-04 ENCOUNTER — Encounter: Admitting: Dermatology

## 2025-04-05 ENCOUNTER — Ambulatory Visit: Admitting: Dermatology
